# Patient Record
Sex: Male | Born: 1975 | Race: White | Hispanic: No | Marital: Married | Smoking: Former smoker
Health system: Southern US, Community
[De-identification: ages and names within clinical notes are randomized; demographics above are authoritative.]

## PROBLEM LIST (undated history)

## (undated) HISTORY — PX: KNEE SURGERY: SHX244

## (undated) HISTORY — PX: HIP SURGERY: SHX245

## (undated) HISTORY — PX: OTHER SURGICAL HISTORY: SHX169

---

## 1999-10-30 ENCOUNTER — Emergency Department (HOSPITAL_COMMUNITY): Admission: EM | Admit: 1999-10-30 | Discharge: 1999-10-30 | Payer: Self-pay | Admitting: Emergency Medicine

## 1999-10-30 ENCOUNTER — Encounter: Payer: Self-pay | Admitting: Emergency Medicine

## 2005-05-20 ENCOUNTER — Emergency Department (HOSPITAL_COMMUNITY): Admission: EM | Admit: 2005-05-20 | Discharge: 2005-05-20 | Payer: Self-pay | Admitting: Emergency Medicine

## 2008-08-09 ENCOUNTER — Emergency Department (HOSPITAL_COMMUNITY): Admission: EM | Admit: 2008-08-09 | Discharge: 2008-08-09 | Payer: Self-pay | Admitting: Family Medicine

## 2008-10-30 ENCOUNTER — Emergency Department (HOSPITAL_BASED_OUTPATIENT_CLINIC_OR_DEPARTMENT_OTHER): Admission: EM | Admit: 2008-10-30 | Discharge: 2008-10-30 | Payer: Self-pay | Admitting: Emergency Medicine

## 2008-10-30 ENCOUNTER — Ambulatory Visit: Payer: Self-pay | Admitting: Diagnostic Radiology

## 2009-07-22 ENCOUNTER — Encounter: Admission: RE | Admit: 2009-07-22 | Discharge: 2009-07-22 | Payer: Self-pay | Admitting: Family Medicine

## 2009-09-13 ENCOUNTER — Inpatient Hospital Stay (HOSPITAL_COMMUNITY): Admission: AC | Admit: 2009-09-13 | Discharge: 2009-09-22 | Payer: Self-pay

## 2009-11-21 ENCOUNTER — Ambulatory Visit (HOSPITAL_COMMUNITY): Admission: RE | Admit: 2009-11-21 | Discharge: 2009-11-21 | Payer: Self-pay | Admitting: Orthopedic Surgery

## 2009-11-24 ENCOUNTER — Ambulatory Visit (HOSPITAL_COMMUNITY): Admission: RE | Admit: 2009-11-24 | Discharge: 2009-11-24 | Payer: Self-pay | Admitting: Orthopedic Surgery

## 2009-12-07 ENCOUNTER — Emergency Department (HOSPITAL_BASED_OUTPATIENT_CLINIC_OR_DEPARTMENT_OTHER): Admission: EM | Admit: 2009-12-07 | Discharge: 2009-12-07 | Payer: Self-pay | Admitting: Emergency Medicine

## 2010-01-17 ENCOUNTER — Ambulatory Visit (HOSPITAL_BASED_OUTPATIENT_CLINIC_OR_DEPARTMENT_OTHER): Admission: RE | Admit: 2010-01-17 | Discharge: 2010-01-17 | Payer: Self-pay | Admitting: Specialist

## 2010-07-31 ENCOUNTER — Emergency Department (HOSPITAL_COMMUNITY): Payer: Commercial Managed Care - PPO

## 2010-07-31 ENCOUNTER — Emergency Department (HOSPITAL_COMMUNITY)
Admission: EM | Admit: 2010-07-31 | Discharge: 2010-07-31 | Disposition: A | Discharge status: Home / Self Care | Payer: Commercial Managed Care - PPO | Attending: Emergency Medicine | Admitting: Emergency Medicine

## 2010-07-31 DIAGNOSIS — M545 Low back pain, unspecified: Secondary | ICD-10-CM | POA: Insufficient documentation

## 2010-09-09 LAB — POCT HEMOGLOBIN-HEMACUE: Hemoglobin: 14.4 g/dL (ref 13.0–17.0)

## 2010-09-18 LAB — PROTIME-INR: INR: 1.06 (ref 0.00–1.49)

## 2010-09-18 LAB — COMPREHENSIVE METABOLIC PANEL
ALT: 12 U/L (ref 0–53)
AST: 22 U/L (ref 0–37)
Albumin: 2.5 g/dL — ABNORMAL LOW (ref 3.5–5.2)
Albumin: 2.7 g/dL — ABNORMAL LOW (ref 3.5–5.2)
Alkaline Phosphatase: 42 U/L (ref 39–117)
Alkaline Phosphatase: 44 U/L (ref 39–117)
BUN: 6 mg/dL (ref 6–23)
Chloride: 106 mEq/L (ref 96–112)
Creatinine, Ser: 1 mg/dL (ref 0.4–1.5)
Creatinine, Ser: 1.01 mg/dL (ref 0.4–1.5)
GFR calc Af Amer: >60 mL/min (ref >60)
GFR calc non Af Amer: >60 mL/min (ref >60)
GFR calc non Af Amer: >60 mL/min (ref >60)
Glucose, Bld: 107 mg/dL — ABNORMAL HIGH (ref 70–99)
Potassium: 3.5 mEq/L (ref 3.5–5.1)
Sodium: 138 mEq/L (ref 135–145)
Total Bilirubin: 0.7 mg/dL (ref 0.3–1.2)

## 2010-09-18 LAB — CBC
HCT: 24.3 % — ABNORMAL LOW (ref 39.0–52.0)
HCT: 24.4 % — ABNORMAL LOW (ref 39.0–52.0)
HCT: 27.9 % — ABNORMAL LOW (ref 39.0–52.0)
HCT: 29.2 % — ABNORMAL LOW (ref 39.0–52.0)
Hemoglobin: 14.4 g/dL (ref 13.0–17.0)
Hemoglobin: 8.3 g/dL — ABNORMAL LOW (ref 13.0–17.0)
Hemoglobin: 8.4 g/dL — ABNORMAL LOW (ref 13.0–17.0)
Hemoglobin: 9.7 g/dL — ABNORMAL LOW (ref 13.0–17.0)
Hemoglobin: 9.8 g/dL — ABNORMAL LOW (ref 13.0–17.0)
MCHC: 33.8 g/dL (ref 30.0–36.0)
MCHC: 34 g/dL (ref 30.0–36.0)
MCHC: 34.3 g/dL (ref 30.0–36.0)
MCHC: 34.7 g/dL (ref 30.0–36.0)
MCHC: 34.8 g/dL (ref 30.0–36.0)
MCHC: 35.1 g/dL (ref 30.0–36.0)
MCV: 93.1 fL (ref 78.0–100.0)
MCV: 93.3 fL (ref 78.0–100.0)
MCV: 93.8 fL (ref 78.0–100.0)
Platelets: 159 10*3/uL (ref 150–400)
Platelets: 253 10*3/uL (ref 150–400)
Platelets: 277 10*3/uL (ref 150–400)
Platelets: 293 10*3/uL (ref 150–400)
RBC: 2.58 MIL/uL — ABNORMAL LOW (ref 4.22–5.81)
RBC: 3 MIL/uL — ABNORMAL LOW (ref 4.22–5.81)
RBC: 3.06 MIL/uL — ABNORMAL LOW (ref 4.22–5.81)
RBC: 3.12 MIL/uL — ABNORMAL LOW (ref 4.22–5.81)
RBC: 3.19 MIL/uL — ABNORMAL LOW (ref 4.22–5.81)
RBC: 4.45 MIL/uL (ref 4.22–5.81)
RDW: 12.3 % (ref 11.5–15.5)
RDW: 12.8 % (ref 11.5–15.5)
RDW: 12.8 % (ref 11.5–15.5)
RDW: 12.9 % (ref 11.5–15.5)
RDW: 13.1 % (ref 11.5–15.5)
WBC: 10.4 10*3/uL (ref 4.0–10.5)
WBC: 12.1 10*3/uL — ABNORMAL HIGH (ref 4.0–10.5)
WBC: 8.1 10*3/uL (ref 4.0–10.5)

## 2010-09-18 LAB — TYPE AND SCREEN: Antibody Screen: NEGATIVE

## 2010-09-18 LAB — POCT I-STAT 4, (NA,K, GLUC, HGB,HCT)
Glucose, Bld: 123 mg/dL — ABNORMAL HIGH (ref 70–99)
Glucose, Bld: 140 mg/dL — ABNORMAL HIGH (ref 70–99)
Glucose, Bld: 169 mg/dL — ABNORMAL HIGH (ref 70–99)
HCT: 23 % — ABNORMAL LOW (ref 39.0–52.0)
HCT: 24 % — ABNORMAL LOW (ref 39.0–52.0)
Hemoglobin: 7.8 g/dL — ABNORMAL LOW (ref 13.0–17.0)
Hemoglobin: 8.2 g/dL — ABNORMAL LOW (ref 13.0–17.0)
Potassium: 4 meq/L (ref 3.5–5.1)
Potassium: 4.2 meq/L (ref 3.5–5.1)
Sodium: 138 meq/L (ref 135–145)
Sodium: 138 meq/L (ref 135–145)
Sodium: 139 meq/L (ref 135–145)

## 2010-09-18 LAB — BASIC METABOLIC PANEL
BUN: 10 mg/dL (ref 6–23)
BUN: 13 mg/dL (ref 6–23)
BUN: 6 mg/dL (ref 6–23)
BUN: 6 mg/dL (ref 6–23)
BUN: 9 mg/dL (ref 6–23)
CO2: 31 mEq/L (ref 19–32)
CO2: 31 mEq/L (ref 19–32)
CO2: 32 mEq/L (ref 19–32)
CO2: 34 mEq/L — ABNORMAL HIGH (ref 19–32)
Calcium: 8.4 mg/dL (ref 8.4–10.5)
Calcium: 8.6 mg/dL (ref 8.4–10.5)
Calcium: 8.7 mg/dL (ref 8.4–10.5)
Calcium: 8.8 mg/dL (ref 8.4–10.5)
Calcium: 8.9 mg/dL (ref 8.4–10.5)
Chloride: 100 mEq/L (ref 96–112)
Chloride: 102 mEq/L (ref 96–112)
Creatinine, Ser: 0.99 mg/dL (ref 0.4–1.5)
Creatinine, Ser: 1.01 mg/dL (ref 0.4–1.5)
Creatinine, Ser: 1.02 mg/dL (ref 0.4–1.5)
Creatinine, Ser: 1.03 mg/dL (ref 0.4–1.5)
GFR calc Af Amer: >60 mL/min (ref >60)
GFR calc Af Amer: >60 mL/min (ref >60)
GFR calc Af Amer: >60 mL/min (ref >60)
GFR calc Af Amer: >60 mL/min (ref >60)
GFR calc Af Amer: >60 mL/min (ref >60)
GFR calc non Af Amer: 59 mL/min — ABNORMAL LOW (ref >60)
GFR calc non Af Amer: >60 mL/min (ref >60)
GFR calc non Af Amer: >60 mL/min (ref >60)
GFR calc non Af Amer: >60 mL/min (ref >60)
Glucose, Bld: 103 mg/dL — ABNORMAL HIGH (ref 70–99)
Glucose, Bld: 110 mg/dL — ABNORMAL HIGH (ref 70–99)
Glucose, Bld: 111 mg/dL — ABNORMAL HIGH (ref 70–99)
Glucose, Bld: 159 mg/dL — ABNORMAL HIGH (ref 70–99)
Glucose, Bld: 94 mg/dL (ref 70–99)
Potassium: 3 meq/L — ABNORMAL LOW (ref 3.5–5.1)
Potassium: 3.6 mEq/L (ref 3.5–5.1)
Potassium: 3.8 mEq/L (ref 3.5–5.1)
Potassium: 3.8 mEq/L (ref 3.5–5.1)
Potassium: 3.9 mEq/L (ref 3.5–5.1)
Sodium: 136 mEq/L (ref 135–145)
Sodium: 138 mEq/L (ref 135–145)
Sodium: 138 mEq/L (ref 135–145)
Sodium: 139 mEq/L (ref 135–145)
Sodium: 140 meq/L (ref 135–145)

## 2010-09-18 LAB — MRSA PCR SCREENING: MRSA by PCR: NEGATIVE

## 2010-09-18 LAB — ABO/RH: ABO/RH(D): O NEG

## 2010-12-05 ENCOUNTER — Inpatient Hospital Stay (INDEPENDENT_AMBULATORY_CARE_PROVIDER_SITE_OTHER)
Admission: RE | Admit: 2010-12-05 | Discharge: 2010-12-05 | Disposition: A | Discharge status: Home / Self Care | Payer: Commercial Managed Care - PPO | Source: Ambulatory Visit | Attending: Family Medicine | Admitting: Family Medicine

## 2010-12-05 DIAGNOSIS — L923 Foreign body granuloma of the skin and subcutaneous tissue: Secondary | ICD-10-CM

## 2012-05-11 ENCOUNTER — Emergency Department (HOSPITAL_COMMUNITY)
Admission: EM | Admit: 2012-05-11 | Discharge: 2012-05-11 | Disposition: A | Discharge status: Home / Self Care | Payer: Commercial Managed Care - PPO | Source: Home / Self Care | Attending: Emergency Medicine | Admitting: Emergency Medicine

## 2012-05-11 ENCOUNTER — Emergency Department (INDEPENDENT_AMBULATORY_CARE_PROVIDER_SITE_OTHER): Payer: Commercial Managed Care - PPO

## 2012-05-11 ENCOUNTER — Encounter (HOSPITAL_COMMUNITY): Payer: Self-pay | Admitting: Emergency Medicine

## 2012-05-11 DIAGNOSIS — T148XXA Other injury of unspecified body region, initial encounter: Secondary | ICD-10-CM

## 2012-05-11 DIAGNOSIS — R229 Localized swelling, mass and lump, unspecified: Secondary | ICD-10-CM

## 2012-05-11 MED ORDER — IBUPROFEN 800 MG PO TABS: 800.0000 mg | ORAL_TABLET | Freq: Three times a day (TID) | ORAL | Status: DC

## 2012-05-11 NOTE — ED Notes (Signed)
Pt states that he smashed his middle finger on the right hand between two pieces of metal yesterday evening finger is swollen and gradually getting worse.

## 2012-05-11 NOTE — ED Provider Notes (Signed)
Medical screening examination/treatment/procedure(s) were performed by non-physician practitioner and as supervising physician I was immediately available for consultation/collaboration.  Luz Mares, M.D.   Mackinsey Pelland C Jadamarie Butson, MD 05/11/12 2138 

## 2012-05-11 NOTE — ED Provider Notes (Signed)
History     CSN: [id number]  Arrival date & time 05/11/12  1646   First MD Initiated Contact with Patient 05/11/12 1952      Chief Complaint  Patient presents with  . Finger Injury    right hand middle finger injury. pt smashed finger btwn two pieces of metal. finger is swollen and gradually getting worse. injury happend yesterday afternoon    (Consider location/radiation/quality/duration/timing/severity/associated sxs/prior treatment) HPI Comments: Pt reports R hand/middle finger was smashed between to heavy metal objects yesterday. Is concerned something is broken.  C/o swelling, pain.  Reports tetanus shot within 2 years.   Patient is a 36 y.o. male presenting with hand pain. The history is provided by the patient.  Hand Pain This is a new problem. The current episode started yesterday. The problem occurs constantly. The problem has not changed since onset.Exacerbated by: movement, activity, palpation. Nothing relieves the symptoms. He has tried a cold compress (ibuprofen) for the symptoms. The treatment provided no relief.    History reviewed. No pertinent past medical history.  Past Surgical History  Procedure Date  . Arm   . Hip surgery     left  . Thumb surgery   . Knee surgery     right knee    History reviewed. No pertinent family history.  History  Substance Use Topics  . Smoking status: Never Smoker   . Smokeless tobacco: Not on file  . Alcohol Use: No      Review of Systems  Constitutional: Negative for fever and chills.  Musculoskeletal: Positive for joint swelling.  Skin: Positive for wound. Negative for color change.  Neurological: Positive for numbness.    Allergies  Review of patient's allergies indicates no known allergies.  Home Medications   Current Outpatient Rx  Name  Route  Sig  Dispense  Refill  . IBUPROFEN 800 MG PO TABS   Oral   Take 1 tablet (800 mg total) by mouth 3 (three) times daily.   21 tablet   0     BP 123/65   Pulse 59  Temp 98.7 F (37.1 C) (Oral)  Resp 20  SpO2 97%  Physical Exam  Constitutional: He appears well-developed and well-nourished. No distress.  Musculoskeletal:       Right hand: He exhibits decreased range of motion, tenderness, bony tenderness and swelling.       Hands: Neurological:       Sensation intact to R middle finger  Skin: Skin is warm, dry and intact. No bruising noted. No pallor.       Brisk cap refill to R distal middle finger    ED Course  Procedures (including critical care time)  Labs Reviewed - No data to display Dg Finger Middle Right  05/11/2012  *RADIOLOGY REPORT*  Clinical Data: Right long finger injury.  RIGHT MIDDLE FINGER 2+V  Comparison: None.  Findings: Soft tissue swelling is present over the proximal phalanx of the right long finger.  No fracture.  No radiopaque foreign body.  IMPRESSION: Soft tissue swelling without osseous injury.   Original Report Authenticated By: Andreas Newport, M.D.      1. Crush injury   2. Soft tissue swelling       MDM  Reviewed with pt reasons for seeking emergent care        Cathlyn Parsons, NP 05/11/12 2133

## 2012-06-09 ENCOUNTER — Encounter (HOSPITAL_COMMUNITY): Payer: Self-pay | Admitting: *Deleted

## 2012-06-09 ENCOUNTER — Emergency Department (HOSPITAL_COMMUNITY)
Admission: EM | Admit: 2012-06-09 | Discharge: 2012-06-09 | Disposition: A | Discharge status: Home / Self Care | Payer: Commercial Managed Care - PPO | Source: Home / Self Care | Attending: Emergency Medicine | Admitting: Emergency Medicine

## 2012-06-09 ENCOUNTER — Emergency Department (INDEPENDENT_AMBULATORY_CARE_PROVIDER_SITE_OTHER): Payer: Commercial Managed Care - PPO

## 2012-06-09 DIAGNOSIS — T148XXA Other injury of unspecified body region, initial encounter: Secondary | ICD-10-CM

## 2012-06-09 MED ORDER — HYDROCODONE-ACETAMINOPHEN 5-325 MG PO TABS
2.0000 | ORAL_TABLET | Freq: Once | ORAL | Status: AC
  Administered 2012-06-09: 2 via ORAL

## 2012-06-09 MED ORDER — HYDROCODONE-ACETAMINOPHEN 5-325 MG PO TABS
ORAL_TABLET | ORAL | Status: AC
  Filled 2012-06-09: qty 2

## 2012-06-09 MED ORDER — HYDROCODONE-ACETAMINOPHEN 5-325 MG PO TABS: ORAL_TABLET | ORAL | Status: DC

## 2012-06-09 NOTE — ED Notes (Signed)
Ice   And   Elevation

## 2012-06-09 NOTE — ED Notes (Signed)
xl  r  Arm  Sling  Ace  Bandage  Wrapped  Not to  Tight and  Applied  r  Arm

## 2012-06-09 NOTE — ED Provider Notes (Signed)
Chief Complaint  Patient presents with  . 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 This afternoon around 2:30 PM he injured his right arm, crushing it between the steel lip of his bucket truck and the trunk of a tree. There is swelling over the forearm. He is able to move his elbow and his wrist although it hurts to move his fingers. He has some numbness and tingling of the tips of his middle 3 fingers. There is no muscle weakness other than that due to pain.  Review of Systems:  Other than noted above, the patient denies any of the following symptoms: Systemic:  No fevers, chills, sweats, or aches.  No fatigue or tiredness. Musculoskeletal:  No joint pain, arthritis, bursitis, swelling, back pain, or neck pain. Neurological:  No muscular weakness, paresthesias, headache, or trouble with speech or coordination.  No dizziness.  PMFSH:  Past medical history, family history, social history, meds, and allergies were reviewed.  Physical Exam:   Vital signs:  BP 144/84  Pulse 72  Temp 98.6 F (37 C) (Oral)  SpO2 100% Gen:  Alert and oriented times 3.  In no distress. Musculoskeletal: There is swelling and bruising over the volar and the dorsal forearm and is tender to touch in that area. Is no obvious deformity. He is able to move his elbow and his wrist but with pain. He is able to flex extend his fingers but with pain. Radial pulses full. Good capillary refill of all his fingers. He has good sensation to light touch but diminished 2 point discrimination in the middle 3 fingers. Otherwise, all joints had a full a ROM with no swelling, bruising or deformity.  No edema, pulses full. Extremities were warm and pink.  Capillary refill was brisk.  Skin:  Clear, warm and dry.  No rash. Neuro:  Alert and oriented times 3.  Muscle strength was normal.  Sensation was intact to light touch.   Radiology:  Dg Forearm Right  06/09/2012   *RADIOLOGY REPORT*  Clinical Data: Traumatic injury with pain  RIGHT FOREARM - 2 VIEW  Comparison: None.  Findings: Soft tissue swelling in the mid forearm is noted.  No acute fracture or dislocation is seen.  IMPRESSION: The soft tissue swelling without bony abnormality.   Original Report Authenticated By: Alcide Clever, M.D.    I reviewed the images independently and personally and concur with the radiologist's findings.  Course in Urgent Care Center:   He was placed in a sling and an Ace wrap was applied loosely. He was given hydrocodone/APAP 5/325, 2 capsules by mouth and tolerated this well without any immediate side effects.  Assessment:  The encounter diagnosis was Crush injury.  There is no evidence right now for compartment syndrome although this is a definite possibility particularly with swelling. I told him to keep a watch out for the symptoms which would include pallor, paresthesias, or increase in pain. His wife knows how to check his pulse, and I told her to keep a watch on that as well. I told him to rest the next few days, elevate the extremity, use ice, and Ace wrap, and is to followup with Dr. Amanda Pea in 2 days.  Plan:   1.  The following meds were prescribed:   New Prescriptions   HYDROCODONE-ACETAMINOPHEN (NORCO/VICODIN) 5-325 MG PER TABLET    1 to 2 tabs every 4 to 6 hours as needed for  pain.   2.  The patient was instructed in symptomatic care, including rest and activity, elevation, application of ice and compression.  Appropriate handouts were given. 3.  The patient was told to return if becoming worse in any way, if no better in 3 or 4 days, and given some red flag symptoms that would indicate earlier return.   4.  The patient was told to follow up with Dr. Amanda Pea in 2 days.    Reuben Likes, MD 06/09/12 2032

## 2012-06-09 NOTE — ED Notes (Signed)
Pt  Reports  He     Sustained   A         Crush  Injury  To   r  forerarm          He  Has  Pain  /   Swelling  To  The  Affected  Arm  With  Bruising  Present       rom is  Limited          He     Is  Awake  Alert  And  Oriented  He  Ambulated  To room with a  Steady  Fluid  Gait

## 2012-06-11 ENCOUNTER — Observation Stay (HOSPITAL_COMMUNITY)
Admission: RE | Admit: 2012-06-11 | Discharge: 2012-06-12 | Disposition: A | Discharge status: Home / Self Care | Payer: 59 | Source: Ambulatory Visit | Attending: Orthopedic Surgery | Admitting: Orthopedic Surgery

## 2012-06-11 ENCOUNTER — Encounter (HOSPITAL_COMMUNITY): Payer: Self-pay | Admitting: Anesthesiology

## 2012-06-11 ENCOUNTER — Encounter (HOSPITAL_COMMUNITY): Payer: Self-pay | Admitting: Pharmacy Technician

## 2012-06-11 ENCOUNTER — Encounter (HOSPITAL_COMMUNITY): Payer: Self-pay | Admitting: *Deleted

## 2012-06-11 ENCOUNTER — Ambulatory Visit (HOSPITAL_COMMUNITY): Payer: 59 | Admitting: Anesthesiology

## 2012-06-11 ENCOUNTER — Encounter (HOSPITAL_COMMUNITY)
Admission: RE | Disposition: A | Discharge status: Home / Self Care | Payer: Self-pay | Source: Ambulatory Visit | Attending: Orthopedic Surgery

## 2012-06-11 DIAGNOSIS — T79A0XA Compartment syndrome, unspecified, initial encounter: Secondary | ICD-10-CM

## 2012-06-11 DIAGNOSIS — W230XXA Caught, crushed, jammed, or pinched between moving objects, initial encounter: Secondary | ICD-10-CM | POA: Insufficient documentation

## 2012-06-11 DIAGNOSIS — S5780XA Crushing injury of unspecified forearm, initial encounter: Principal | ICD-10-CM | POA: Insufficient documentation

## 2012-06-11 DIAGNOSIS — T79A19A Traumatic compartment syndrome of unspecified upper extremity, initial encounter: Secondary | ICD-10-CM | POA: Insufficient documentation

## 2012-06-11 DIAGNOSIS — Z23 Encounter for immunization: Secondary | ICD-10-CM | POA: Insufficient documentation

## 2012-06-11 HISTORY — PX: FASCIOTOMY: SHX132

## 2012-06-11 LAB — CBC
HCT: 42.7 % (ref 39.0–52.0)
Hemoglobin: 14.6 g/dL (ref 13.0–17.0)
MCHC: 34.2 g/dL (ref 30.0–36.0)
RBC: 4.66 MIL/uL (ref 4.22–5.81)
WBC: 11 10*3/uL — ABNORMAL HIGH (ref 4.0–10.5)

## 2012-06-11 LAB — SURGICAL PCR SCREEN
MRSA, PCR: NEGATIVE
Staphylococcus aureus: NEGATIVE

## 2012-06-11 SURGERY — FASCIOTOMY, UPPER EXTREMITY
Anesthesia: General | Site: Arm Lower | Laterality: Right | Wound class: Clean

## 2012-06-11 MED ORDER — FENTANYL CITRATE 0.05 MG/ML IJ SOLN
INTRAMUSCULAR | Status: AC
  Filled 2012-06-11: qty 2

## 2012-06-11 MED ORDER — METHOCARBAMOL 500 MG PO TABS
500.0000 mg | ORAL_TABLET | Freq: Four times a day (QID) | ORAL | Status: DC | PRN
  Administered 2012-06-11 – 2012-06-12 (×2): 500 mg via ORAL
  Filled 2012-06-11 (×2): qty 1

## 2012-06-11 MED ORDER — ALPRAZOLAM 0.5 MG PO TABS
0.5000 mg | ORAL_TABLET | Freq: Four times a day (QID) | ORAL | Status: DC | PRN
  Administered 2012-06-12: 0.5 mg via ORAL
  Filled 2012-06-11: qty 1

## 2012-06-11 MED ORDER — MUPIROCIN 2 % EX OINT: TOPICAL_OINTMENT | Freq: Once | CUTANEOUS | Status: DC

## 2012-06-11 MED ORDER — CEFAZOLIN SODIUM 1-5 GM-% IV SOLN
1.0000 g | Freq: Three times a day (TID) | INTRAVENOUS | Status: DC
  Administered 2012-06-11 – 2012-06-12 (×3): 1 g via INTRAVENOUS
  Filled 2012-06-11 (×5): qty 50

## 2012-06-11 MED ORDER — LACTATED RINGERS IV SOLN
INTRAVENOUS | Status: DC
  Administered 2012-06-11: 11:00:00 via INTRAVENOUS

## 2012-06-11 MED ORDER — CEFAZOLIN SODIUM-DEXTROSE 2-3 GM-% IV SOLR
2.0000 g | INTRAVENOUS | Status: AC
  Administered 2012-06-11: 2 g via INTRAVENOUS

## 2012-06-11 MED ORDER — KETOROLAC TROMETHAMINE 30 MG/ML IJ SOLN
15.0000 mg | Freq: Once | INTRAMUSCULAR | Status: AC | PRN
  Administered 2012-06-11: 15 mg via INTRAVENOUS

## 2012-06-11 MED ORDER — ONDANSETRON HCL 4 MG PO TABS: 4.0000 mg | ORAL_TABLET | Freq: Four times a day (QID) | ORAL | Status: DC | PRN

## 2012-06-11 MED ORDER — NEOSTIGMINE METHYLSULFATE 1 MG/ML IJ SOLN
INTRAMUSCULAR | Status: DC | PRN
  Administered 2012-06-11: 2 mg via INTRAVENOUS

## 2012-06-11 MED ORDER — VITAMIN C 500 MG PO TABS
1000.0000 mg | ORAL_TABLET | Freq: Every day | ORAL | Status: DC
  Administered 2012-06-12: 1000 mg via ORAL
  Filled 2012-06-11 (×2): qty 2

## 2012-06-11 MED ORDER — DIPHENHYDRAMINE HCL 25 MG PO CAPS
25.0000 mg | ORAL_CAPSULE | Freq: Four times a day (QID) | ORAL | Status: DC | PRN
  Administered 2012-06-12: 50 mg via ORAL
  Filled 2012-06-11: qty 2

## 2012-06-11 MED ORDER — SODIUM CHLORIDE 0.45 % IV SOLN: INTRAVENOUS | Status: DC

## 2012-06-11 MED ORDER — SUCCINYLCHOLINE CHLORIDE 20 MG/ML IJ SOLN
INTRAMUSCULAR | Status: DC | PRN
  Administered 2012-06-11: 100 mg via INTRAVENOUS

## 2012-06-11 MED ORDER — PROMETHAZINE HCL 25 MG/ML IJ SOLN: 6.2500 mg | INTRAMUSCULAR | Status: DC | PRN

## 2012-06-11 MED ORDER — ONDANSETRON HCL 4 MG/2ML IJ SOLN
INTRAMUSCULAR | Status: DC | PRN
  Administered 2012-06-11: 4 mg via INTRAVENOUS

## 2012-06-11 MED ORDER — CEFAZOLIN SODIUM-DEXTROSE 2-3 GM-% IV SOLR
INTRAVENOUS | Status: AC
  Filled 2012-06-11: qty 50

## 2012-06-11 MED ORDER — FAMOTIDINE 20 MG PO TABS
20.0000 mg | ORAL_TABLET | Freq: Two times a day (BID) | ORAL | Status: DC | PRN
  Filled 2012-06-11: qty 1

## 2012-06-11 MED ORDER — INFLUENZA VIRUS VACC SPLIT PF IM SUSP
0.5000 mL | INTRAMUSCULAR | Status: AC
  Administered 2012-06-12: 0.5 mL via INTRAMUSCULAR
  Filled 2012-06-11: qty 0.5

## 2012-06-11 MED ORDER — LACTATED RINGERS IV SOLN
INTRAVENOUS | Status: DC | PRN
  Administered 2012-06-11 (×2): via INTRAVENOUS

## 2012-06-11 MED ORDER — GLYCOPYRROLATE 0.2 MG/ML IJ SOLN
INTRAMUSCULAR | Status: DC | PRN
  Administered 2012-06-11: 0.2 mg via INTRAVENOUS
  Administered 2012-06-11: 0.4 mg via INTRAVENOUS

## 2012-06-11 MED ORDER — CEFAZOLIN SODIUM 1-5 GM-% IV SOLN
1.0000 g | INTRAVENOUS | Status: AC
  Filled 2012-06-11 (×2): qty 50

## 2012-06-11 MED ORDER — MORPHINE SULFATE 2 MG/ML IJ SOLN: 1.0000 mg | INTRAMUSCULAR | Status: DC | PRN

## 2012-06-11 MED ORDER — FENTANYL CITRATE 0.05 MG/ML IJ SOLN
INTRAMUSCULAR | Status: DC | PRN
  Administered 2012-06-11: 100 ug via INTRAVENOUS

## 2012-06-11 MED ORDER — 0.9 % SODIUM CHLORIDE (POUR BTL) OPTIME
TOPICAL | Status: DC | PRN
  Administered 2012-06-11: 1000 mL

## 2012-06-11 MED ORDER — MUPIROCIN 2 % EX OINT
TOPICAL_OINTMENT | CUTANEOUS | Status: AC
  Administered 2012-06-11: 1 via NASAL
  Filled 2012-06-11: qty 22

## 2012-06-11 MED ORDER — FENTANYL CITRATE 0.05 MG/ML IJ SOLN
25.0000 ug | INTRAMUSCULAR | Status: DC | PRN
  Administered 2012-06-11 (×4): 25 ug via INTRAVENOUS

## 2012-06-11 MED ORDER — ROCURONIUM BROMIDE 100 MG/10ML IV SOLN
INTRAVENOUS | Status: DC | PRN
  Administered 2012-06-11: 10 mg via INTRAVENOUS
  Administered 2012-06-11: 5 mg via INTRAVENOUS

## 2012-06-11 MED ORDER — MIDAZOLAM HCL 2 MG/2ML IJ SOLN: 0.5000 mg | Freq: Once | INTRAMUSCULAR | Status: DC | PRN

## 2012-06-11 MED ORDER — TEMAZEPAM 15 MG PO CAPS
15.0000 mg | ORAL_CAPSULE | Freq: Every evening | ORAL | Status: DC | PRN
  Administered 2012-06-12: 15 mg via ORAL
  Filled 2012-06-11: qty 1

## 2012-06-11 MED ORDER — MIDAZOLAM HCL 5 MG/5ML IJ SOLN
INTRAMUSCULAR | Status: DC | PRN
  Administered 2012-06-11: 2 mg via INTRAVENOUS

## 2012-06-11 MED ORDER — MEPERIDINE HCL 25 MG/ML IJ SOLN: 6.2500 mg | INTRAMUSCULAR | Status: DC | PRN

## 2012-06-11 MED ORDER — PROPOFOL 10 MG/ML IV BOLUS
INTRAVENOUS | Status: DC | PRN
  Administered 2012-06-11: 200 mg via INTRAVENOUS

## 2012-06-11 MED ORDER — OXYCODONE HCL 5 MG PO TABS
5.0000 mg | ORAL_TABLET | ORAL | Status: DC | PRN
  Administered 2012-06-11 – 2012-06-12 (×5): 10 mg via ORAL
  Filled 2012-06-11 (×2): qty 2
  Filled 2012-06-11: qty 1
  Filled 2012-06-11 (×3): qty 2

## 2012-06-11 MED ORDER — ONDANSETRON HCL 4 MG/2ML IJ SOLN: 4.0000 mg | Freq: Four times a day (QID) | INTRAMUSCULAR | Status: DC | PRN

## 2012-06-11 MED ORDER — CHLORHEXIDINE GLUCONATE 4 % EX LIQD: 60.0000 mL | Freq: Once | CUTANEOUS | Status: DC

## 2012-06-11 MED ORDER — KETOROLAC TROMETHAMINE 30 MG/ML IJ SOLN
INTRAMUSCULAR | Status: AC
  Filled 2012-06-11: qty 1

## 2012-06-11 MED ORDER — SENNA 8.6 MG PO TABS
1.0000 | ORAL_TABLET | Freq: Two times a day (BID) | ORAL | Status: DC
  Administered 2012-06-11 – 2012-06-12 (×2): 8.6 mg via ORAL
  Filled 2012-06-11 (×3): qty 1

## 2012-06-11 MED ORDER — METHOCARBAMOL 100 MG/ML IJ SOLN
500.0000 mg | Freq: Four times a day (QID) | INTRAMUSCULAR | Status: DC | PRN
Start: 2012-06-11 — End: 2012-06-12
  Filled 2012-06-11: qty 5

## 2012-06-11 MED ORDER — LIDOCAINE HCL (CARDIAC) 20 MG/ML IV SOLN
INTRAVENOUS | Status: DC | PRN
  Administered 2012-06-11: 50 mg via INTRAVENOUS

## 2012-06-11 SURGICAL SUPPLY — 45 items
BANDAGE ELASTIC 3 VELCRO ST LF (GAUZE/BANDAGES/DRESSINGS) ×2 IMPLANT
BANDAGE ELASTIC 4 VELCRO ST LF (GAUZE/BANDAGES/DRESSINGS) ×2 IMPLANT
BANDAGE GAUZE ELAST BULKY 4 IN (GAUZE/BANDAGES/DRESSINGS) ×2 IMPLANT
BLADE SURG ROTATE 9660 (MISCELLANEOUS) IMPLANT
BNDG CMPR 9X4 STRL LF SNTH (GAUZE/BANDAGES/DRESSINGS) ×1
BNDG ESMARK 4X9 LF (GAUZE/BANDAGES/DRESSINGS) ×2 IMPLANT
CLOTH BEACON ORANGE TIMEOUT ST (SAFETY) ×2 IMPLANT
CORDS BIPOLAR (ELECTRODE) ×2 IMPLANT
COVER SURGICAL LIGHT HANDLE (MISCELLANEOUS) ×2 IMPLANT
CUFF TOURNIQUET SINGLE 18IN (TOURNIQUET CUFF) ×2 IMPLANT
CUFF TOURNIQUET SINGLE 24IN (TOURNIQUET CUFF) IMPLANT
DECANTER SPIKE VIAL GLASS SM (MISCELLANEOUS) IMPLANT
DRAIN TLS ROUND 10FR (DRAIN) IMPLANT
DRAPE OEC MINIVIEW 54X84 (DRAPES) IMPLANT
DRAPE U-SHAPE 47X51 STRL (DRAPES) ×1 IMPLANT
GAUZE XEROFORM 1X8 LF (GAUZE/BANDAGES/DRESSINGS) ×2 IMPLANT
GLOVE ORTHO TXT STRL SZ7.5 (GLOVE) ×2 IMPLANT
GLOVE SS BIOGEL STRL SZ 8 (GLOVE) ×1 IMPLANT
GLOVE SUPERSENSE BIOGEL SZ 8 (GLOVE) ×1
GOWN PREVENTION PLUS XLARGE (GOWN DISPOSABLE) ×2 IMPLANT
GOWN STRL NON-REIN LRG LVL3 (GOWN DISPOSABLE) ×6 IMPLANT
GOWN STRL REIN XL XLG (GOWN DISPOSABLE) ×4 IMPLANT
KIT BASIN OR (CUSTOM PROCEDURE TRAY) ×2 IMPLANT
KIT ROOM TURNOVER OR (KITS) ×2 IMPLANT
LOOP VESSEL MAXI BLUE (MISCELLANEOUS) IMPLANT
MANIFOLD NEPTUNE II (INSTRUMENTS) ×2 IMPLANT
NEEDLE 22X1 1/2 (OR ONLY) (NEEDLE) IMPLANT
NS IRRIG 1000ML POUR BTL (IV SOLUTION) ×2 IMPLANT
PACK ORTHO EXTREMITY (CUSTOM PROCEDURE TRAY) ×2 IMPLANT
PAD ARMBOARD 7.5X6 YLW CONV (MISCELLANEOUS) ×4 IMPLANT
PAD CAST 4YDX4 CTTN HI CHSV (CAST SUPPLIES) ×1 IMPLANT
PADDING CAST COTTON 4X4 STRL (CAST SUPPLIES) ×2
SPONGE GAUZE 4X4 12PLY (GAUZE/BANDAGES/DRESSINGS) ×2 IMPLANT
SPONGE LAP 4X18 X RAY DECT (DISPOSABLE) ×1 IMPLANT
SUT MNCRL AB 4-0 PS2 18 (SUTURE) ×1 IMPLANT
SUT PROLENE 3 0 PS 2 (SUTURE) ×1 IMPLANT
SUT VIC AB 3-0 FS2 27 (SUTURE) IMPLANT
SYR CONTROL 10ML LL (SYRINGE) IMPLANT
SYSTEM CHEST DRAIN TLS 7FR (DRAIN) IMPLANT
TOWEL OR 17X24 6PK STRL BLUE (TOWEL DISPOSABLE) ×2 IMPLANT
TOWEL OR 17X26 10 PK STRL BLUE (TOWEL DISPOSABLE) ×2 IMPLANT
TUBE CONNECTING 12X1/4 (SUCTIONS) ×2 IMPLANT
TUBE EVACUATION TLS (MISCELLANEOUS) ×1 IMPLANT
UNDERPAD 30X30 INCONTINENT (UNDERPADS AND DIAPERS) ×2 IMPLANT
WATER STERILE IRR 1000ML POUR (IV SOLUTION) ×2 IMPLANT

## 2012-06-11 NOTE — Preoperative (Signed)
Beta Blockers   Reason not to administer Beta Blockers:Not Applicable 

## 2012-06-11 NOTE — Transfer of Care (Signed)
Immediate Anesthesia Transfer of Care Note  Patient: Eduardo Potts  Procedure(s) Performed: Procedure(s) (LRB) with comments: FASCIOTOMY (Right)  Patient Location: PACU  Anesthesia Type:General  Level of Consciousness: awake and alert   Airway & Oxygen Therapy: Patient Spontanous Breathing and Patient connected to nasal cannula oxygen  Post-op Assessment: Report given to PACU RN and Post -op Vital signs reviewed and stable  Post vital signs: Reviewed and stable  Complications: No apparent anesthesia complications

## 2012-06-11 NOTE — Progress Notes (Signed)
Dr. Jean Rosenthal made aware of patient drinking coffee with cream+ sugar 700 am( 8 oz.).  DR. Amanda Pea STATES HE NEEDS TO DO SURGERY DUE TO COMPARTMENT SYNDROME (PER  ADRIAN MURPHY).  DR. Jean Rosenthal, WAS MADE AWARE OF URGENCY.

## 2012-06-11 NOTE — Anesthesia Preprocedure Evaluation (Signed)
Anesthesia Evaluation  Patient identified by MRN, date of birth, ID band Patient awake    Reviewed: Allergy & Precautions, H&P , Patient's Chart, lab work & pertinent test results, reviewed documented beta blocker date and time   History of Anesthesia Complications Negative for: history of anesthetic complications  Airway Mallampati: II TM Distance: >3 FB Neck ROM: full    Dental No notable dental hx.    Pulmonary neg pulmonary ROS,  breath sounds clear to auscultation  Pulmonary exam normal       Cardiovascular Exercise Tolerance: Good negative cardio ROS  Rhythm:regular Rate:Normal     Neuro/Psych negative neurological ROS  negative psych ROS   GI/Hepatic negative GI ROS, Neg liver ROS,   Endo/Other  negative endocrine ROS  Renal/GU negative Renal ROS     Musculoskeletal   Abdominal   Peds  Hematology negative hematology ROS (+)   Anesthesia Other Findings Not adequately NPO.  Discussed with surgeon who says he cannot wait.    Reproductive/Obstetrics negative OB ROS                           Anesthesia Physical Anesthesia Plan  ASA: I and emergent  Anesthesia Plan: General ETT   Post-op Pain Management:    Induction: Rapid sequence, Cricoid pressure planned and Intravenous  Airway Management Planned: Oral ETT  Additional Equipment:   Intra-op Plan:   Post-operative Plan:   Informed Consent: I have reviewed the patients History and Physical, chart, labs and discussed the procedure including the risks, benefits and alternatives for the proposed anesthesia with the patient or authorized representative who has indicated his/her understanding and acceptance.   Dental Advisory Given  Plan Discussed with: CRNA and Surgeon  Anesthesia Plan Comments:         Anesthesia Quick Evaluation

## 2012-06-11 NOTE — Op Note (Signed)
See dictation# [id number] Dominica Severin MD

## 2012-06-11 NOTE — H&P (Signed)
Eduardo Potts is an 36 y.o. male.   Chief Complaint: Compartment syndrome right arm HPI: Patient presents with a compartment syndrome involving in nature about the right upper extremity. He has progressive pain numbness and loss of function. He had his forearm sandwiched between is bucket and a tree. The patient was seen in the emergency room. Date of injury was 2 days ago. Since that time his had evolving numbness and tingling pain cannot sleep and has difficulty with function. I discussed with him these issues and the risk and benefits of surgical release of his compartments. Another patient quite well as I performed reconstructive surgery of his left arm after chainsaw went through it in the distant past. He fortunately did rather well with this. He understands risk and benefits of surgery. We'll proceed with fasciotomies.  History reviewed. No pertinent past medical history.  Past Surgical History  Procedure Date  . Arm   . Hip surgery     left  . Thumb surgery   . Knee surgery     right knee    History reviewed. No pertinent family history. Social History:  reports that he has never smoked. He does not have any smokeless tobacco history on file. He reports that he does not drink alcohol or use illicit drugs.  Allergies:  Allergies  Allergen Reactions  . Hydrocodone-Acetaminophen Itching    Medications Prior to Admission  Medication Sig Dispense Refill  . HYDROcodone-acetaminophen (NORCO/VICODIN) 5-325 MG per tablet Take 1-2 tablets by mouth every 4 (four) hours as needed. For pain.        Results for orders placed during the hospital encounter of 06/11/12 (from the past 48 hour(s))  CBC     Status: Abnormal   Collection Time   06/11/12 10:08 AM      Component Value Range Comment   WBC 11.0 (*) 4.0 - 10.5 K/uL    RBC 4.66  4.22 - 5.81 MIL/uL    Hemoglobin 14.6  13.0 - 17.0 g/dL    HCT 45.4  09.8 - 11.9 %    MCV 91.6  78.0 - 100.0 fL    MCH 31.3  26.0 - 34.0 pg    MCHC 34.2  30.0 - 36.0 g/dL    RDW 14.7  82.9 - 56.2 %    Platelets 217  150 - 400 K/uL    Dg Forearm Right  06/09/2012  *RADIOLOGY REPORT*  Clinical Data: Traumatic injury with pain  RIGHT FOREARM - 2 VIEW  Comparison: None.  Findings: Soft tissue swelling in the mid forearm is noted.  No acute fracture or dislocation is seen.  IMPRESSION: The soft tissue swelling without bony abnormality.   Original Report Authenticated By: Alcide Clever, M.D.     Review of Systems  Constitutional: Negative.   HENT: Negative.   Eyes: Negative.   Respiratory: Negative.   Cardiovascular: Negative.   Gastrointestinal: Negative.   Genitourinary: Negative.   Skin: Negative.   Neurological: Negative.   Endo/Heme/Allergies: Negative.   Psychiatric/Behavioral: Negative.     Blood pressure 106/69, pulse 78, temperature 98.4 F (36.9 C), temperature source Oral, resp. rate 20, height 6\' 1"  (1.854 m), weight 94.2 kg (207 lb 10.8 oz), SpO2 96.00%. Physical Exam evolving compartment syndrome right forearm numbness and tingling about the superficial radial nerve ulnar nerve and median nerve is appreciated he has no more pulse he has pain with passive extension of the fingers and limited motion secondary to pain. His compartments are firm. He has  no evidence of lower extremity trauma.Marland KitchenMarland KitchenThe patient is alert and oriented in no acute distress the patient complains of pain in the affected upper extremity. The patient is noted to have a normal HEENT exam. Lung fields show equal chest expansion and no shortness of breath abdomen exam is nontender without distention. Lower extremity examination does not show any fracture dislocation or blood clot symptoms. Pelvis is stable neck and back are stable and nontender  Assessment/Plan .Marland KitchenWe are planning surgery for your upper extremity. The risk and benefits of surgery include risk of bleeding infection anesthesia damage to normal structures and failure of the surgery to accomplish  its intended goals of relieving symptoms and restoring function with this in mind we'll going to proceed. I have specifically discussed with the patient the pre-and postoperative regime and the does and don'ts and risk and benefits in great detail. Risk and benefits of surgery also include risk of dystrophy chronic nerve pain failure of the healing process to go onto completion and other inherent risks of surgery The relavent the pathophysiology of the disease/injury process, as well as the alternatives for treatment and postoperative course of action has been discussed in great detail with the patient who desires to proceed.  We will do everything in our power to help you (the patient) restore function to the upper extremity. Is a pleasure to see this patient today.   Irvine Glorioso III,Louvina Cleary M 06/11/2012, 11:40 AM

## 2012-06-11 NOTE — Anesthesia Postprocedure Evaluation (Signed)
Anesthesia Post Note  Patient: Eduardo Potts  Procedure(s) Performed: Procedure(s) (LRB): FASCIOTOMY (Right)  Anesthesia type: GA  Patient location: PACU  Post pain: Pain level controlled  Post assessment: Post-op Vital signs reviewed  Last Vitals:  Filed Vitals:   06/11/12 1317  BP: 124/72  Pulse: 92  Temp:   Resp: 15    Post vital signs: Reviewed  Level of consciousness: sedated  Complications: No apparent anesthesia complications

## 2012-06-11 NOTE — Anesthesia Procedure Notes (Signed)
Procedure Name: Intubation Date/Time: 06/11/2012 11:56 AM Performed by: Gwenyth Allegra Pre-anesthesia Checklist: Patient identified, Timeout performed, Emergency Drugs available, Suction available and Patient being monitored Patient Re-evaluated:Patient Re-evaluated prior to inductionOxygen Delivery Method: Circle system utilized Preoxygenation: Pre-oxygenation with 100% oxygen Intubation Type: IV induction, Cricoid Pressure applied and Rapid sequence Laryngoscope Size: Mac and 4 Grade View: Grade I Tube type: Oral Tube size: 7.5 mm Number of attempts: 1 Airway Equipment and Method: Stylet Secured at: 22 cm Tube secured with: Tape Dental Injury: Teeth and Oropharynx as per pre-operative assessment

## 2012-06-12 ENCOUNTER — Encounter (HOSPITAL_COMMUNITY): Payer: Self-pay | Admitting: Orthopedic Surgery

## 2012-06-12 MED ORDER — METHOCARBAMOL 500 MG PO TABS: 500.0000 mg | ORAL_TABLET | Freq: Four times a day (QID) | ORAL | Status: DC | PRN

## 2012-06-12 MED ORDER — ALPRAZOLAM 0.5 MG PO TABS: 0.5000 mg | ORAL_TABLET | Freq: Every evening | ORAL | Status: DC | PRN

## 2012-06-12 MED ORDER — HYDROMORPHONE HCL PF 1 MG/ML IJ SOLN
INTRAMUSCULAR | Status: AC
  Administered 2012-06-12: 0.5 mg
  Filled 2012-06-12: qty 1

## 2012-06-12 MED ORDER — HYDROMORPHONE HCL PF 1 MG/ML IJ SOLN
0.5000 mg | INTRAMUSCULAR | Status: DC | PRN
  Administered 2012-06-12 (×2): 1 mg via INTRAVENOUS
  Filled 2012-06-12 (×2): qty 1

## 2012-06-12 NOTE — Discharge Summary (Signed)
Physician Discharge Summary  Patient ID: Eduardo Potts MRN: [id number] DOB/AGE: 1976-05-01 36 y.o.  Admit date: 06/11/2012 Discharge date: 06/12/2012  Admission Diagnoses: Evolving compartment syndrome right forearm, s/p crush injury  Discharge Diagnoses:  Same, improved  Discharged Condition: Improved  Hospital Course: Pt. Pleasant 36 yo male who presented to [facility] for evaluation of his right upper extremity. He sustained a crush injury Monday to the right forearm. The patient noted increasing pain, numbness of the fingers, and swelling of the forearm. Evaluation was concerning for evolving compartment syndrome, thus patient was admitted and underwent fasciotomies to the right forearm. Please see operative report for full details.  Patient was admitted for IV pain management, antibiotics, and close observation. He did extremely well and had no postop complications. He had noted improvement in pain and numbness about the digits. He was tolerating a regular diet and voiding without difficulties. The decision was made to discharge him home.  Consults: None  Treatments: See operative report  Discharge Exam: Blood pressure 134/74, pulse 62, temperature 98.7 F (37.1 C), temperature source Oral, resp. rate 18, height 6\' 1"  (1.854 m), weight 94.2 kg (207 lb 10.8 oz), SpO2 96.00%. Marland Kitchen.The patient is alert and oriented in no acute distress the patient complains of pain in the affected upper extremity. The patient is noted to have a normal HEENT exam. Lung fields show equal chest expansion and no shortness of breath abdomen exam is nontender without distention. Lower extremity examination does not show any fracture dislocation or blood clot symptoms. Pelvis is stable neck and back are stable and nontender The right upper extremity splint is intact, digital rom intact, gross sensation intact. Radial, ulnar, median nerve intact  Disposition: 01-Home or Self Care  Discharge  Orders    Future Orders Please Complete By Expires   Diet - low sodium heart healthy      Call MD / Call 911      Comments:   If you experience chest pain or shortness of breath, CALL 911 and be transported to the hospital emergency room.  If you develope a fever above 101 F, pus (white drainage) or increased drainage or redness at the wound, or calf pain, call your surgeon's office.   Constipation Prevention      Comments:   Drink plenty of fluids.  Prune juice may be helpful.  You may use a stool softener, such as Colace (over the counter) 100 mg twice a day.  Use MiraLax (over the counter) for constipation as needed.   Increase activity slowly as tolerated      Discharge instructions      Comments:   Marland KitchenMarland KitchenKeep bandage clean and dry.  Call for any problems.  No smoking.  Criteria for driving a car: you should be off your pain medicine for 7-8 hours, able to drive one handed(confident), thinking clearly and feeling able in your judgement to drive. Continue elevation as it will decrease swelling.  If instructed by MD move your fingers within the confines of the bandage/splint.  Use ice if instructed by your MD. Call immediately for any sudden loss of feeling in your hand/arm or change in functional abilities of the extremity. Marland Kitchen.We recommend that you to take vitamin C 1000 mg a day to promote healing we also recommend that if you require her pain medicine that he take a stool softener to prevent constipation as most pain medicines will have constipation side effects. We recommend either Peri-Colace or Senokot and recommend that you also  consider adding MiraLAX to prevent the constipation affects from pain medicine if you are required to use them. These medicines are over the counter and maybe purchased at a local pharmacy.       Medication List     As of 06/12/2012  5:13 PM    STOP taking these medications         HYDROcodone-acetaminophen 5-325 MG per tablet   Commonly known as: NORCO/VICODIN       TAKE these medications         ALPRAZolam 0.5 MG tablet   Commonly known as: XANAX   Take 1 tablet (0.5 mg total) by mouth at bedtime as needed for sleep or anxiety.      methocarbamol 500 MG tablet   Commonly known as: ROBAXIN   Take 1 tablet (500 mg total) by mouth every 6 (six) hours as needed.         SignedSheran Lawless 06/12/2012, 5:13 PM

## 2012-06-12 NOTE — Progress Notes (Signed)
Patient provided with discharge instructions and follow up information. He is in stable condition and will be going home with his wife.

## 2012-06-12 NOTE — Op Note (Signed)
NAMEMarland Kitchen  Eduardo Potts, Eduardo Potts NO.:  [id number]  MEDICAL RECORD NO.:  [id number]  LOCATION:  5N02C                        FACILITY:  [facility]  PHYSICIAN:  Dionne Ano. Ilan Kahrs, M.D.DATE OF BIRTH:  22-Jan-1976  DATE OF PROCEDURE: DATE OF DISCHARGE:                              OPERATIVE REPORT   PREOPERATIVE DIAGNOSIS:  Compartment syndrome, right forearm.  POSTOPERATIVE DIAGNOSIS:  Compartment syndrome, right forearm.  PROCEDURE:  Fasciotomy, volar superficial and deep and dorsal compartments, right forearm.  SURGEON:  Dionne Ano. Amanda Pea, M.D.  ASSISTANT:  None.  COMPLICATION:  None.  ANESTHESIA:  General.  TOURNIQUET TIME:  Less than 20 minutes.  DRAINS:  3.  INDICATIONS:  The patient is a pleasant male who had his arm sandwiched between a tree box and a tree.  This happened 48 hours ago.  Since that time, he has had evolving compartmental phenomenon including pain, paresthesias, difficulty with flexion and extension, and worsening symptomatology.  I have examined him in my office today and felt that certainly he needed to proceed with the avenue of care, which is compartmental release to try and get all the pressure off of his neurovascular structures and muscles.  OPERATIVE PROCEDURE:  The patient was seen by myself and anesthesia, taken to the operative suite.  Arm was marked.  Pre and postop checklist were completed.  He was prepped and draped in usual sterile fashion with Betadine scrub and paint.  After 10 minutes of surgical Betadine scrub, we then elevated the tourniquet.  I made a 3-4-inch incision about the midline volar form region, dissected down and performed compartmental release.  I specifically released all of the superficial compartments including FCU, FCR, and FDS.  I then dissected deep and made sure that the profundus muscle belly was nicely released also.  Following fasciotomy, I then turned attention dorsally where I performed an extensor  apparatus fasciotomy, this was done through a 1.5- to 2-inch incision followed by releasing of the fascia.  I was very careful in sleep, blunt-tip scissor tips proximal and distal to the incisions to make sure he had full release about the entire form.  Given the preoperative examination, I did not feel that the carpal tunnel release would be necessary and did not perform this.  The patient tolerated this well.  Tourniquet was deflated.  Arm picked up beautifully.  There were no complicating features.  I placed drains and did close him in loose fashion with Prolene.  Compartments were very soft. Everything looked quite well and I was pleased with the findings.  I discussed this with he and his family, and admitted overnight for IV antibiotics, actually discharged him with more.  Do's and don'ts have been discussed and all questions have been encouraged and answered.     Dionne Ano. Amanda Pea, M.D.     [facility]  D:  06/11/2012  T:  06/12/2012  Job:  [id number]

## 2012-06-12 NOTE — Progress Notes (Signed)
There were no SCD's or foot pumps in the hospital available. Pt has been very ambulatory, walking around in room to the bathroom multiple times. Instructed to ambulate as much as possible.

## 2012-07-14 ENCOUNTER — Emergency Department (HOSPITAL_BASED_OUTPATIENT_CLINIC_OR_DEPARTMENT_OTHER)
Admission: EM | Admit: 2012-07-14 | Discharge: 2012-07-14 | Disposition: A | Discharge status: Home / Self Care | Payer: 59 | Attending: Emergency Medicine | Admitting: Emergency Medicine

## 2012-07-14 ENCOUNTER — Emergency Department (HOSPITAL_BASED_OUTPATIENT_CLINIC_OR_DEPARTMENT_OTHER): Payer: 59

## 2012-07-14 ENCOUNTER — Encounter (HOSPITAL_BASED_OUTPATIENT_CLINIC_OR_DEPARTMENT_OTHER): Payer: Self-pay | Admitting: *Deleted

## 2012-07-14 DIAGNOSIS — Y929 Unspecified place or not applicable: Secondary | ICD-10-CM | POA: Insufficient documentation

## 2012-07-14 DIAGNOSIS — W312XXA Contact with powered woodworking and forming machines, initial encounter: Secondary | ICD-10-CM | POA: Insufficient documentation

## 2012-07-14 DIAGNOSIS — IMO0002 Reserved for concepts with insufficient information to code with codable children: Secondary | ICD-10-CM

## 2012-07-14 DIAGNOSIS — Z79899 Other long term (current) drug therapy: Secondary | ICD-10-CM | POA: Insufficient documentation

## 2012-07-14 DIAGNOSIS — S81009A Unspecified open wound, unspecified knee, initial encounter: Secondary | ICD-10-CM | POA: Insufficient documentation

## 2012-07-14 DIAGNOSIS — Y9389 Activity, other specified: Secondary | ICD-10-CM | POA: Insufficient documentation

## 2012-07-14 MED ORDER — IBUPROFEN 600 MG PO TABS: 600.0000 mg | ORAL_TABLET | Freq: Four times a day (QID) | ORAL | Status: DC | PRN

## 2012-07-14 MED ORDER — LIDOCAINE-EPINEPHRINE 2 %-1:100000 IJ SOLN
INTRAMUSCULAR | Status: AC
  Administered 2012-07-14: 1 mL
  Filled 2012-07-14: qty 1

## 2012-07-14 MED ORDER — SULFAMETHOXAZOLE-TRIMETHOPRIM 800-160 MG PO TABS: 1.0000 | ORAL_TABLET | Freq: Two times a day (BID) | ORAL | Status: DC

## 2012-07-14 MED ORDER — CEFAZOLIN SODIUM 1-5 GM-% IV SOLN
1.0000 g | Freq: Once | INTRAVENOUS | Status: AC
  Administered 2012-07-14: 1 g via INTRAVENOUS
  Filled 2012-07-14: qty 50

## 2012-07-14 MED ORDER — CEPHALEXIN 500 MG PO CAPS: 500.0000 mg | ORAL_CAPSULE | Freq: Four times a day (QID) | ORAL | Status: DC

## 2012-07-14 MED ORDER — IBUPROFEN 200 MG PO TABS
ORAL_TABLET | ORAL | Status: AC
  Administered 2012-07-14: 600 mg
  Filled 2012-07-14: qty 3

## 2012-07-14 MED ORDER — SODIUM CHLORIDE 0.9 % IV SOLN: INTRAVENOUS | Status: DC

## 2012-07-14 NOTE — ED Notes (Signed)
MD at bedside. 

## 2012-07-14 NOTE — ED Notes (Signed)
Laceration to his left knee with a chain saw. Ambulatory.

## 2012-07-14 NOTE — ED Provider Notes (Signed)
Medical screening examination/treatment/procedure(s) were performed by non-physician practitioner and as supervising physician I was immediately available for consultation/collaboration.   Pt with a complex laceration to the right knee from a saw. He has 3 or 4 cuts. The couple of lacerations are deep, and patient had 3 deep sutures placed, absorbables. No fracture per Xray, and ROM of the knee was normal. Pt got a dose of ancef in the ED, he is UTD with tetanus.  We discussed the risk of infection. i extensively cleaned the wound with lavage NS and betadine.   LACERATION REPAIR Date/Time: 07/14/2012 4:54 PM Performed by: Derwood Kaplan Authorized by: Derwood Kaplan Consent: Verbal consent obtained. Risks and benefits: risks, benefits and alternatives were discussed Consent given by: patient Patient understanding: patient states understanding of the procedure being performed Patient identity confirmed: verbally with patient Body area: lower extremity Location details: right knee Laceration length: 25 cm Contamination: The wound is contaminated. Foreign bodies: unknown Tendon involvement: none Nerve involvement: superficial Vascular damage: no Anesthesia: local infiltration Local anesthetic: lidocaine 2% with epinephrine Anesthetic total: 10 ml Patient sedated: no Preparation: Patient was prepped and draped in the usual sterile fashion. Irrigation solution: saline Irrigation method: jet lavage Amount of cleaning: extensive Debridement: minimal Degree of undermining: minimal Skin closure: 3-0 nylon and 4-0 nylon Mucous membrane closure: 4-0 Chromic gut Number of sutures: 23 Technique: simple and horizontal mattress Approximation: close Approximation difficulty: complex Dressing: antibiotic ointment Patient tolerance: Patient tolerated the procedure well with no immediate complications.    Derwood Kaplan, MD 07/14/12 1706

## 2012-07-14 NOTE — ED Provider Notes (Signed)
History     CSN: [id number]  Arrival date & time 07/14/12  1412   First MD Initiated Contact with Patient 07/14/12 1428      Chief Complaint  Patient presents with  . Laceration    (Consider location/radiation/quality/duration/timing/severity/associated sxs/prior treatment) Patient is a 37 y.o. male presenting with skin laceration. The history is provided by the patient. No language interpreter was used.  Laceration  The incident occurred less than 1 hour ago. Pain location: R knee. The laceration is 10 cm in size. Injury mechanism: Chain saw. The pain is at a severity of 4/10. The pain is moderate. The pain has been constant since onset. He reports no foreign bodies present. His tetanus status is UTD.   37 year old male coming in with a chain saw accident to his left knee. He states that the chain saw bounced off a tree and into his knee. Ligaments and bone exposed. Bleeding controlled. Multiple small lacerations surrounding the major 10 cm injury. Tetanus up to date.   History reviewed. No pertinent past medical history.  Past Surgical History  Procedure Date  . Arm   . Hip surgery     left  . Thumb surgery   . Knee surgery     right knee  . Fasciotomy 06/11/2012    Procedure: FASCIOTOMY;  Surgeon: Dominica Severin, MD;  Location: [facility] OR;  Service: Orthopedics;  Laterality: Right;    No family history on file.  History  Substance Use Topics  . Smoking status: Never Smoker   . Smokeless tobacco: Not on file  . Alcohol Use: No      Review of Systems  Constitutional: Negative.   HENT: Negative.   Eyes: Negative.   Respiratory: Negative.   Cardiovascular: Negative.   Gastrointestinal: Negative.   Musculoskeletal:       Left knee laceration injury  Neurological: Negative.   Psychiatric/Behavioral: Negative.   All other systems reviewed and are negative.    Allergies  Morphine and related  Home Medications   Current Outpatient Rx  Name  Route  Sig  Dispense   Refill  . ALPRAZOLAM 0.5 MG PO TABS   Oral   Take 1 tablet (0.5 mg total) by mouth at bedtime as needed for sleep or anxiety.   30 tablet   0   . METHOCARBAMOL 500 MG PO TABS   Oral   Take 1 tablet (500 mg total) by mouth every 6 (six) hours as needed.   30 tablet   0     There were no vitals taken for this visit.  Physical Exam  Nursing note and vitals reviewed. Constitutional: He is oriented to person, place, and time. He appears well-developed and well-nourished.  HENT:  Head: Normocephalic.  Eyes: Conjunctivae normal and EOM are normal. Pupils are equal, round, and reactive to light.  Neck: Normal range of motion. Neck supple.  Cardiovascular: Normal rate.   Pulmonary/Chest: Effort normal.  Abdominal: Soft.  Musculoskeletal: Normal range of motion.  Neurological: He is alert and oriented to person, place, and time.  Skin: Skin is warm and dry.       Left knee laceration from chain saw injury. Bone and ligaments exposed. Multiple small lacerations surrounding the large 10 cm laceration. +CMS below injury  Psychiatric: He has a normal mood and affect.    ED Course  Procedures (including critical care time) Dr Rhunette Croft in to see patient.  He will repair the complex laceration to the L knee Labs Reviewed - No  data to display No results found.   No diagnosis found.    MDM          Remi Haggard, NP 07/15/12 1445  Remi Haggard, NP 07/15/12 1446

## 2012-07-17 NOTE — ED Provider Notes (Signed)
Medical screening examination/treatment/procedure(s) were conducted as a shared visit with non-physician practitioner(s) and myself.  I personally evaluated the patient during the encounter  DETAILED LAC REPAIR NOTE IS SEPARATE  Derwood Kaplan, MD 07/17/12 1312

## 2012-07-18 ENCOUNTER — Encounter (HOSPITAL_COMMUNITY)
Admission: AD | Disposition: A | Discharge status: Home / Self Care | Payer: Self-pay | Source: Ambulatory Visit | Attending: Orthopedic Surgery

## 2012-07-18 ENCOUNTER — Encounter (HOSPITAL_COMMUNITY): Payer: Self-pay | Admitting: Anesthesiology

## 2012-07-18 ENCOUNTER — Inpatient Hospital Stay (HOSPITAL_COMMUNITY): Payer: 59 | Admitting: Anesthesiology

## 2012-07-18 ENCOUNTER — Inpatient Hospital Stay (HOSPITAL_COMMUNITY)
Admission: AD | Admit: 2012-07-18 | Discharge: 2012-07-21 | DRG: 902 | Disposition: A | Discharge status: Home / Self Care | Payer: 59 | Source: Ambulatory Visit | Attending: Orthopedic Surgery | Admitting: Orthopedic Surgery

## 2012-07-18 ENCOUNTER — Other Ambulatory Visit: Payer: Self-pay | Admitting: Orthopedic Surgery

## 2012-07-18 ENCOUNTER — Encounter (HOSPITAL_COMMUNITY): Payer: Self-pay | Admitting: General Practice

## 2012-07-18 DIAGNOSIS — W298XXA Contact with other powered powered hand tools and household machinery, initial encounter: Secondary | ICD-10-CM | POA: Diagnosis present

## 2012-07-18 DIAGNOSIS — L02419 Cutaneous abscess of limb, unspecified: Secondary | ICD-10-CM | POA: Diagnosis present

## 2012-07-18 DIAGNOSIS — S81809A Unspecified open wound, unspecified lower leg, initial encounter: Principal | ICD-10-CM | POA: Diagnosis present

## 2012-07-18 DIAGNOSIS — S81009A Unspecified open wound, unspecified knee, initial encounter: Principal | ICD-10-CM | POA: Diagnosis present

## 2012-07-18 DIAGNOSIS — Y929 Unspecified place or not applicable: Secondary | ICD-10-CM

## 2012-07-18 DIAGNOSIS — L03116 Cellulitis of left lower limb: Secondary | ICD-10-CM

## 2012-07-18 DIAGNOSIS — Z885 Allergy status to narcotic agent status: Secondary | ICD-10-CM

## 2012-07-18 DIAGNOSIS — Z792 Long term (current) use of antibiotics: Secondary | ICD-10-CM

## 2012-07-18 HISTORY — PX: I & D EXTREMITY: SHX5045

## 2012-07-18 LAB — GENTAMICIN LEVEL, RANDOM: Gentamicin Rm: 2.1 ug/mL

## 2012-07-18 SURGERY — IRRIGATION AND DEBRIDEMENT EXTREMITY
Anesthesia: General | Site: Knee | Laterality: Left | Wound class: Dirty or Infected

## 2012-07-18 MED ORDER — ONDANSETRON HCL 4 MG/2ML IJ SOLN: 4.0000 mg | Freq: Four times a day (QID) | INTRAMUSCULAR | Status: DC | PRN

## 2012-07-18 MED ORDER — SODIUM CHLORIDE 0.9 % IV SOLN
3.0000 g | Freq: Four times a day (QID) | INTRAVENOUS | Status: DC
  Administered 2012-07-18 – 2012-07-21 (×12): 3 g via INTRAVENOUS
  Filled 2012-07-18 (×14): qty 3

## 2012-07-18 MED ORDER — LACTATED RINGERS IV SOLN
INTRAVENOUS | Status: DC
  Administered 2012-07-18 (×2): via INTRAVENOUS
  Administered 2012-07-19: 50 mL/h via INTRAVENOUS

## 2012-07-18 MED ORDER — HYDROMORPHONE HCL PF 1 MG/ML IJ SOLN
0.2500 mg | INTRAMUSCULAR | Status: DC | PRN
  Administered 2012-07-18 (×3): 0.5 mg via INTRAVENOUS

## 2012-07-18 MED ORDER — SODIUM CHLORIDE 0.9 % IR SOLN
Status: DC | PRN
  Administered 2012-07-18: 3000 mL

## 2012-07-18 MED ORDER — GENTAMICIN SULFATE 40 MG/ML IJ SOLN
650.0000 mg | INTRAVENOUS | Status: DC
  Administered 2012-07-18 – 2012-07-20 (×3): 650 mg via INTRAVENOUS
  Filled 2012-07-18 (×4): qty 16.25

## 2012-07-18 MED ORDER — FENTANYL CITRATE 0.05 MG/ML IJ SOLN
INTRAMUSCULAR | Status: DC | PRN
  Administered 2012-07-18 (×5): 50 ug via INTRAVENOUS

## 2012-07-18 MED ORDER — PROPOFOL 10 MG/ML IV BOLUS
INTRAVENOUS | Status: DC | PRN
  Administered 2012-07-18: 200 mg via INTRAVENOUS

## 2012-07-18 MED ORDER — SODIUM CHLORIDE 0.9 % IR SOLN
Status: DC | PRN
  Administered 2012-07-18: 16:00:00

## 2012-07-18 MED ORDER — OXYCODONE-ACETAMINOPHEN 5-325 MG PO TABS
1.0000 | ORAL_TABLET | ORAL | Status: DC | PRN
  Administered 2012-07-18 – 2012-07-19 (×5): 2 via ORAL
  Administered 2012-07-20 (×2): 1 via ORAL
  Administered 2012-07-20 – 2012-07-21 (×3): 2 via ORAL
  Administered 2012-07-21 (×2): 1 via ORAL
  Filled 2012-07-18 (×6): qty 2
  Filled 2012-07-18: qty 1
  Filled 2012-07-18 (×4): qty 2

## 2012-07-18 MED ORDER — LIDOCAINE HCL (CARDIAC) 20 MG/ML IV SOLN
INTRAVENOUS | Status: DC | PRN
  Administered 2012-07-18: 40 mg via INTRAVENOUS

## 2012-07-18 MED ORDER — METHOCARBAMOL 500 MG PO TABS
500.0000 mg | ORAL_TABLET | Freq: Four times a day (QID) | ORAL | Status: DC | PRN
  Administered 2012-07-18 – 2012-07-21 (×6): 500 mg via ORAL
  Filled 2012-07-18 (×7): qty 1

## 2012-07-18 MED ORDER — CHLORHEXIDINE GLUCONATE 4 % EX LIQD
60.0000 mL | Freq: Once | CUTANEOUS | Status: AC
  Administered 2012-07-18: 4 via TOPICAL
  Filled 2012-07-18: qty 60

## 2012-07-18 MED ORDER — ONDANSETRON HCL 4 MG/2ML IJ SOLN
INTRAMUSCULAR | Status: DC | PRN
  Administered 2012-07-18: 4 mg via INTRAVENOUS

## 2012-07-18 MED ORDER — HYDROMORPHONE HCL PF 1 MG/ML IJ SOLN
1.0000 mg | INTRAMUSCULAR | Status: DC | PRN
  Administered 2012-07-18 – 2012-07-19 (×7): 1 mg via INTRAVENOUS
  Filled 2012-07-18 (×7): qty 1

## 2012-07-18 MED ORDER — ALPRAZOLAM 0.5 MG PO TABS
0.5000 mg | ORAL_TABLET | Freq: Every evening | ORAL | Status: DC | PRN
  Administered 2012-07-18: 0.5 mg via ORAL
  Filled 2012-07-18: qty 1

## 2012-07-18 MED ORDER — LACTATED RINGERS IV SOLN
INTRAVENOUS | Status: DC | PRN
  Administered 2012-07-18: 16:00:00 via INTRAVENOUS

## 2012-07-18 MED ORDER — MIDAZOLAM HCL 5 MG/5ML IJ SOLN
INTRAMUSCULAR | Status: DC | PRN
  Administered 2012-07-18: 2 mg via INTRAVENOUS

## 2012-07-18 MED ORDER — HYDROMORPHONE HCL PF 1 MG/ML IJ SOLN
INTRAMUSCULAR | Status: AC
  Filled 2012-07-18: qty 1

## 2012-07-18 SURGICAL SUPPLY — 54 items
BAG DECANTER FOR FLEXI CONT (MISCELLANEOUS) ×1 IMPLANT
BANDAGE ELASTIC 4 VELCRO ST LF (GAUZE/BANDAGES/DRESSINGS) ×1 IMPLANT
BANDAGE GAUZE ELAST BULKY 4 IN (GAUZE/BANDAGES/DRESSINGS) ×2 IMPLANT
BNDG CMPR MED 10X6 ELC LF (GAUZE/BANDAGES/DRESSINGS) ×1
BNDG COHESIVE 4X5 TAN STRL (GAUZE/BANDAGES/DRESSINGS) ×1 IMPLANT
BNDG ELASTIC 6X10 VLCR STRL LF (GAUZE/BANDAGES/DRESSINGS) ×1 IMPLANT
BRUSH SCRUB DISP (MISCELLANEOUS) ×2 IMPLANT
CLOTH BEACON ORANGE TIMEOUT ST (SAFETY) ×2 IMPLANT
COVER SURGICAL LIGHT HANDLE (MISCELLANEOUS) ×2 IMPLANT
CUFF TOURNIQUET SINGLE 18IN (TOURNIQUET CUFF) ×1 IMPLANT
CUFF TOURNIQUET SINGLE 24IN (TOURNIQUET CUFF) IMPLANT
CUFF TOURNIQUET SINGLE 34IN LL (TOURNIQUET CUFF) ×1 IMPLANT
CUFF TOURNIQUET SINGLE 44IN (TOURNIQUET CUFF) IMPLANT
DRAPE U-SHAPE 47X51 STRL (DRAPES) ×2 IMPLANT
DRSG ADAPTIC 3X8 NADH LF (GAUZE/BANDAGES/DRESSINGS) ×1 IMPLANT
DRSG PAD ABDOMINAL 8X10 ST (GAUZE/BANDAGES/DRESSINGS) ×4 IMPLANT
DURAPREP 26ML APPLICATOR (WOUND CARE) ×2 IMPLANT
ELECT REM PT RETURN 9FT ADLT (ELECTROSURGICAL) ×2
ELECTRODE REM PT RTRN 9FT ADLT (ELECTROSURGICAL) IMPLANT
FACESHIELD LNG OPTICON STERILE (SAFETY) ×1 IMPLANT
GLOVE BIOGEL PI ORTHO PRO 7.5 (GLOVE)
GLOVE BIOGEL PI ORTHO PRO SZ8 (GLOVE) ×1
GLOVE ORTHO TXT STRL SZ7.5 (GLOVE) ×1 IMPLANT
GLOVE PI ORTHO PRO STRL 7.5 (GLOVE) ×1 IMPLANT
GLOVE PI ORTHO PRO STRL SZ8 (GLOVE) ×1 IMPLANT
GLOVE SURG ORTHO 8.5 STRL (GLOVE) ×2 IMPLANT
GOWN STRL NON-REIN LRG LVL3 (GOWN DISPOSABLE) ×2 IMPLANT
GOWN STRL REIN XL XLG (GOWN DISPOSABLE) ×3 IMPLANT
HANDPIECE INTERPULSE COAX TIP (DISPOSABLE) ×2
IMMOBILIZER KNEE 22 UNIV (SOFTGOODS) ×1 IMPLANT
KIT BASIN OR (CUSTOM PROCEDURE TRAY) ×2 IMPLANT
KIT ROOM TURNOVER OR (KITS) ×2 IMPLANT
MANIFOLD NEPTUNE II (INSTRUMENTS) ×2 IMPLANT
NS IRRIG 1000ML POUR BTL (IV SOLUTION) ×2 IMPLANT
PACK ORTHO EXTREMITY (CUSTOM PROCEDURE TRAY) ×2 IMPLANT
PAD ARMBOARD 7.5X6 YLW CONV (MISCELLANEOUS) ×3 IMPLANT
PENCIL BUTTON HOLSTER BLD 10FT (ELECTRODE) IMPLANT
SET HNDPC FAN SPRY TIP SCT (DISPOSABLE) IMPLANT
SPONGE GAUZE 4X4 12PLY (GAUZE/BANDAGES/DRESSINGS) ×1 IMPLANT
SPONGE LAP 18X18 X RAY DECT (DISPOSABLE) ×2 IMPLANT
SPONGE LAP 4X18 X RAY DECT (DISPOSABLE) ×1 IMPLANT
STOCKINETTE IMPERVIOUS 9X36 MD (GAUZE/BANDAGES/DRESSINGS) ×2 IMPLANT
SUT ETHILON 2 0 FS 18 (SUTURE) IMPLANT
SUT ETHILON 2 0 PSLX (SUTURE) ×2 IMPLANT
SUT VIC AB 2-0 CT1 27 (SUTURE)
SUT VIC AB 2-0 CT1 TAPERPNT 27 (SUTURE) IMPLANT
TOWEL OR 17X24 6PK STRL BLUE (TOWEL DISPOSABLE) ×2 IMPLANT
TOWEL OR 17X26 10 PK STRL BLUE (TOWEL DISPOSABLE) ×2 IMPLANT
TUBE ANAEROBIC SPECIMEN COL (MISCELLANEOUS) IMPLANT
TUBE CONNECTING 12X1/4 (SUCTIONS) ×2 IMPLANT
UNDERPAD 30X30 INCONTINENT (UNDERPADS AND DIAPERS) ×1 IMPLANT
WATER STERILE IRR 1000ML POUR (IV SOLUTION) ×1 IMPLANT
WRAP KNEE MAXI GEL POST OP (GAUZE/BANDAGES/DRESSINGS) ×1 IMPLANT
YANKAUER SUCT BULB TIP NO VENT (SUCTIONS) ×2 IMPLANT

## 2012-07-18 NOTE — Anesthesia Preprocedure Evaluation (Addendum)
Anesthesia Evaluation  Patient identified by MRN, date of birth, ID band Patient awake    Reviewed: Allergy & Precautions, H&P , NPO status , Patient's Chart, lab work & pertinent test results  History of Anesthesia Complications Negative for: history of anesthetic complications  Airway Mallampati: II TM Distance: >3 FB Neck ROM: Full    Dental No notable dental hx. (+) Teeth Intact and Dental Advisory Given   Pulmonary neg pulmonary ROS,  breath sounds clear to auscultation  Pulmonary exam normal       Cardiovascular negative cardio ROS  Rhythm:Regular Rate:Normal     Neuro/Psych negative neurological ROS  negative psych ROS   GI/Hepatic negative GI ROS, Neg liver ROS,   Endo/Other  negative endocrine ROS  Renal/GU negative Renal ROS  negative genitourinary   Musculoskeletal negative musculoskeletal ROS (+)   Abdominal   Peds  Hematology negative hematology ROS (+)   Anesthesia Other Findings   Reproductive/Obstetrics negative OB ROS                          Anesthesia Physical Anesthesia Plan  ASA: I  Anesthesia Plan: General   Post-op Pain Management:    Induction: Intravenous  Airway Management Planned: LMA  Additional Equipment:   Intra-op Plan:   Post-operative Plan: Extubation in OR  Informed Consent: I have reviewed the patients History and Physical, chart, labs and discussed the procedure including the risks, benefits and alternatives for the proposed anesthesia with the patient or authorized representative who has indicated his/her understanding and acceptance.   Dental advisory given  Plan Discussed with: CRNA  Anesthesia Plan Comments:         Anesthesia Quick Evaluation

## 2012-07-18 NOTE — Preoperative (Signed)
Beta Blockers   Reason not to administer Beta Blockers:Not Applicable 

## 2012-07-18 NOTE — Interval H&P Note (Signed)
History and Physical Interval Note:  07/18/2012 3:09 PM  Eduardo Potts  has presented today for surgery, with the diagnosis of l knee infection  The various methods of treatment have been discussed with the patient and family. After consideration of risks, benefits and other options for treatment, the patient has consented to  Procedure(s) (LRB) with comments: IRRIGATION AND DEBRIDEMENT EXTREMITY (Left) - I & D L knee as a surgical intervention .  The patient's history has been reviewed, patient examined, no change in status, stable for surgery.  I have reviewed the patient's chart and labs.  Questions were answered to the patient's satisfaction.     Eduardo Potts,STEVEN R

## 2012-07-18 NOTE — Brief Op Note (Signed)
07/18/2012  4:59 PM  PATIENT:  Eduardo Potts  37 y.o. male  PRE-OPERATIVE DIAGNOSIS:  left knee infection, s/p chainsaw injury  POST-OPERATIVE DIAGNOSIS:  left knee infection, s/p chainsaw injury  PROCEDURE:  Procedure(s) (LRB) with comments: IRRIGATION AND DEBRIDEMENT EXTREMITY (Left) - I & D Left knee   SURGEON:  Surgeon(s) and Role:    * Verlee Rossetti, MD - Primary  PHYSICIAN ASSISTANT:   ASSISTANTS: none   ANESTHESIA:   general  EBL:  Total I/O In: 600 [I.V.:600] Out: -   BLOOD ADMINISTERED:none  DRAINS: none   LOCAL MEDICATIONS USED:  NONE  SPECIMEN:  No Specimen  DISPOSITION OF SPECIMEN:  N/A  COUNTS:  YES  TOURNIQUET:  * No tourniquets in log *  DICTATION: .Other Dictation: Dictation Number [id number]  PLAN OF CARE: Admit to inpatient   PATIENT DISPOSITION:  PACU - hemodynamically stable.   Delay start of Pharmacological VTE agent (>24hrs) due to surgical blood loss or risk of bleeding: not applicable

## 2012-07-18 NOTE — H&P (View-Only) (Signed)
Eduardo Potts is an 37 y.o. male.   Chief Complaint: cut my leg HPI: Patient is a pleasant 37 yo male who presents for evaluation of his left lower extremity. Patient sustained a laceration to the left knee with a chainsaw this past Monday. He was seen and evaluated at [facility] and underwent I&D and received IV abx. The patient was released on keflex and bactrim. The patient unfortunately has had increased pain, redness and swelling.  He is seen and evaluated in our office today with signs of ascending cellulitis and poor wound conditions.  No past medical history on file.  Past Surgical History  Procedure Date  . Arm   . Hip surgery     left  . Thumb surgery   . Knee surgery     right knee  . Fasciotomy 06/11/2012    Procedure: FASCIOTOMY;  Surgeon: Dominica Severin, MD;  Location: [facility] OR;  Service: Orthopedics;  Laterality: Right;    No family history on file. Social History:  reports that he has never smoked. He does not have any smokeless tobacco history on file. He reports that he does not drink alcohol or use illicit drugs.  Allergies:  Allergies  Allergen Reactions  . Morphine And Related Itching     (Not in a hospital admission)  No results found for this or any previous visit (from the past 48 hour(s)). No results found.  Review of Systems  Constitutional: Negative.   HENT: Negative.   Eyes: Negative.   Respiratory: Negative.   Cardiovascular: Negative.   Gastrointestinal: Negative.   Genitourinary: Negative.   Musculoskeletal:       See hpi  Skin: Negative.   Neurological: Negative.   Endo/Heme/Allergies: Negative.   Psychiatric/Behavioral: Negative.     There were no vitals taken for this visit. Physical Exam  Constitutional: He is oriented to person, place, and time. He appears well-developed and well-nourished. No distress.  HENT:  Head: Normocephalic and atraumatic.  Eyes: EOM are normal.  Neck: Neck supple.  Respiratory: Effort  normal and breath sounds normal. No respiratory distress. He has no wheezes. He exhibits no tenderness.  GI: Soft.  Musculoskeletal:       Left lower extremity reveals lacerations about the left knee infrapatellar region with surrounding erythema and ascending cellulitis to proximal medial thigh, small laceration about the suprapatellar region noted. Patient able to elicit straight leg raise with some degree of pain Pedal pulses intact, digits pink warm and dry  Neurological: He is alert and oriented to person, place, and time.  Skin: Skin is warm and dry. He is not diaphoretic.  Psychiatric: He has a normal mood and affect.     Assessment/Plan  Left Knee laceration,chainsaw, with ascending cellulitis can not rule out partial patela-tendon injury We will admit the patient and initiate IV abx with formal I&D and repair of associated structures as necessary. Dr. Ranell Patrick will be managing his care.  Eduardo Potts L 07/18/2012, 9:23 AM

## 2012-07-18 NOTE — Progress Notes (Signed)
Aneta Mins RN here to receive report as primary

## 2012-07-18 NOTE — Progress Notes (Signed)
ANTIBIOTIC CONSULT NOTE - INITIAL  Pharmacy Consult for Unasyn and Gentamicin Indication: Infected laceration from chain saw to lt knee.  Allergies  Allergen Reactions  . Morphine And Related Itching    Patient Measurements: Height: 6' 0.83" (185 cm) Weight: 207 lb 10.8 oz (94.2 kg) IBW/kg (Calculated) : 79.52  Adjusted Body Weight:   Vital Signs:   Intake/Output from previous day:   Intake/Output from this shift:    Labs: No results found for this basename: WBC:3,HGB:3,PLT:3,LABCREA:3,CREATININE:3 in the last 72 hours Estimated Creatinine Clearance: 97.5 ml/min (by C-G formula based on Cr of 1.2). No results found for this basename: VANCOTROUGH:2,VANCOPEAK:2,VANCORANDOM:2,GENTTROUGH:2,GENTPEAK:2,GENTRANDOM:2,TOBRATROUGH:2,TOBRAPEAK:2,TOBRARND:2,AMIKACINPEAK:2,AMIKACINTROU:2,AMIKACIN:2, in the last 72 hours   Microbiology: No results found for this or any previous visit (from the past 720 hour(s)).  Medical History: No past medical history on file.  Medications:  Scheduled:    . ampicillin-sulbactam (UNASYN) IV  3 g Intravenous Q6H  . chlorhexidine  60 mL Topical Once  . gentamicin  650 mg Intravenous Q24H   Assessment: 37 yr old male cut his leg with a chain saw on Mon. Was seen at [facility] where knee was stitched up and he was given IV abx, then released on Keflex and Bactrim. Pt has developed ascending cellulitis and poor wound conditions.   Plan:  1) Unasyn 3 Gm IV q6h 2) Gentamicin 650 mg (7 mg/kg) q24 hr.  3) Check gent level in 9-10 hrs to verify dose interval.  Eduardo Potts 07/18/2012,11:09 AM

## 2012-07-18 NOTE — Anesthesia Postprocedure Evaluation (Signed)
  Anesthesia Post-op Note  Patient: Eduardo Potts  Procedure(s) Performed: Procedure(s) (LRB) with comments: IRRIGATION AND DEBRIDEMENT EXTREMITY (Left) - I & D Left knee   Patient Location: PACU  Anesthesia Type:General  Level of Consciousness: awake, alert  and oriented  Airway and Oxygen Therapy: Patient Spontanous Breathing and Patient connected to nasal cannula oxygen  Post-op Pain: mild  Post-op Assessment: Post-op Vital signs reviewed, Patient's Cardiovascular Status Stable, Respiratory Function Stable, Patent Airway and No signs of Nausea or vomiting  Post-op Vital Signs: Reviewed and stable  Complications: No apparent anesthesia complications

## 2012-07-18 NOTE — Anesthesia Procedure Notes (Signed)
Procedure Name: LMA Insertion Date/Time: 07/18/2012 4:10 PM Performed by: Margaree Mackintosh Pre-anesthesia Checklist: Patient identified, Timeout performed, Emergency Drugs available, Suction available and Patient being monitored Patient Re-evaluated:Patient Re-evaluated prior to inductionOxygen Delivery Method: Circle system utilized Preoxygenation: Pre-oxygenation with 100% oxygen Intubation Type: IV induction LMA: LMA inserted LMA Size: 5.0 Number of attempts: 1 Placement Confirmation: positive ETCO2 and breath sounds checked- equal and bilateral Tube secured with: Tape Dental Injury: Teeth and Oropharynx as per pre-operative assessment

## 2012-07-18 NOTE — Transfer of Care (Signed)
Immediate Anesthesia Transfer of Care Note  Patient: Eduardo Potts  Procedure(s) Performed: Procedure(s) (LRB) with comments: IRRIGATION AND DEBRIDEMENT EXTREMITY (Left) - I & D Left knee   Patient Location: PACU  Anesthesia Type:General  Level of Consciousness: awake, alert  and oriented  Airway & Oxygen Therapy: Patient Spontanous Breathing and Patient connected to nasal cannula oxygen  Post-op Assessment: Report given to PACU RN, Post -op Vital signs reviewed and stable and Patient moving all extremities  Post vital signs: Reviewed and stable  Complications: No apparent anesthesia complications

## 2012-07-18 NOTE — H&P (Signed)
Eduardo Potts is an 37 y.o. male.   Chief Complaint: cut my leg HPI: Patient is a pleasant 37 yo male who presents for evaluation of his left lower extremity. Patient sustained a laceration to the left knee with a chainsaw this past Monday. He was seen and evaluated at [place] urgent care and underwent I&D and received IV abx. The patient was released on keflex and bactrim. The patient unfortunately has had increased pain, redness and swelling.  He is seen and evaluated in our office today with signs of ascending cellulitis and poor wound conditions.  No past medical history on file.  Past Surgical History  Procedure Date  . Arm   . Hip surgery     left  . Thumb surgery   . Knee surgery     right knee  . Fasciotomy 06/11/2012    Procedure: FASCIOTOMY;  Surgeon: William Gramig, MD;  Location: [facility] OR;  Service: Orthopedics;  Laterality: Right;    No family history on file. Social History:  reports that he has never smoked. He does not have any smokeless tobacco history on file. He reports that he does not drink alcohol or use illicit drugs.  Allergies:  Allergies  Allergen Reactions  . Morphine And Related Itching     (Not in a hospital admission)  No results found for this or any previous visit (from the past 48 hour(s)). No results found.  Review of Systems  Constitutional: Negative.   HENT: Negative.   Eyes: Negative.   Respiratory: Negative.   Cardiovascular: Negative.   Gastrointestinal: Negative.   Genitourinary: Negative.   Musculoskeletal:       See hpi  Skin: Negative.   Neurological: Negative.   Endo/Heme/Allergies: Negative.   Psychiatric/Behavioral: Negative.     There were no vitals taken for this visit. Physical Exam  Constitutional: He is oriented to person, place, and time. He appears well-developed and well-nourished. No distress.  HENT:  Head: Normocephalic and atraumatic.  Eyes: EOM are normal.  Neck: Neck supple.  Respiratory: Effort  normal and breath sounds normal. No respiratory distress. He has no wheezes. He exhibits no tenderness.  GI: Soft.  Musculoskeletal:       Left lower extremity reveals lacerations about the left knee infrapatellar region with surrounding erythema and ascending cellulitis to proximal medial thigh, small laceration about the suprapatellar region noted. Patient able to elicit straight leg raise with some degree of pain Pedal pulses intact, digits pink warm and dry  Neurological: He is alert and oriented to person, place, and time.  Skin: Skin is warm and dry. He is not diaphoretic.  Psychiatric: He has a normal mood and affect.     Assessment/Plan  Left Knee laceration,chainsaw, with ascending cellulitis can not rule out partial patela-tendon injury We will admit the patient and initiate IV abx with formal I&D and repair of associated structures as necessary. Dr. Norris will be managing his care.  Vane Yapp L 07/18/2012, 9:23 AM    

## 2012-07-19 DIAGNOSIS — L03116 Cellulitis of left lower limb: Secondary | ICD-10-CM | POA: Diagnosis present

## 2012-07-19 LAB — CREATININE, SERUM: GFR calc non Af Amer: 77 mL/min — ABNORMAL LOW (ref >90)

## 2012-07-19 MED ORDER — OXYCODONE-ACETAMINOPHEN 5-325 MG PO TABS: 1.0000 | ORAL_TABLET | ORAL | Status: DC | PRN

## 2012-07-19 MED ORDER — DIPHENHYDRAMINE HCL 25 MG PO CAPS
25.0000 mg | ORAL_CAPSULE | Freq: Four times a day (QID) | ORAL | Status: DC | PRN
  Administered 2012-07-19 – 2012-07-21 (×2): 25 mg via ORAL
  Filled 2012-07-19 (×2): qty 1

## 2012-07-19 MED ORDER — MAGNESIUM HYDROXIDE 400 MG/5ML PO SUSP
30.0000 mL | Freq: Every day | ORAL | Status: DC | PRN
  Administered 2012-07-19: 30 mL via ORAL
  Filled 2012-07-19: qty 30

## 2012-07-19 MED ORDER — AMOXICILLIN-POT CLAVULANATE 500-125 MG PO TABS: 1.0000 | ORAL_TABLET | Freq: Three times a day (TID) | ORAL | Status: DC

## 2012-07-19 NOTE — Discharge Summary (Signed)
  Physician Discharge Summary   Patient ID: MALIKHI OGAN MRN: [id number] DOB/AGE: 1976/03/20 37 y.o.  Admit date: 07/18/2012 Discharge date: 07/19/2012  Admission Diagnoses:  L knee traumatic wound infection  Discharge Diagnoses:  Same   Surgeries: Procedure(s): IRRIGATION AND DEBRIDEMENT EXTREMITY on 07/18/2012   Consultants: none  Discharged Condition: Stable  Hospital Course: KAYA KLAUSING is an 37 y.o. male who was admitted 07/18/2012 with a chief complaint of left knee traumatic wound infection, and found to have a diagnosis of L knee cellulitis.  They were brought to the operating room on 07/18/2012 and underwent the above named procedures.    The patient had an uncomplicated hospital course and was stable for discharge.  Recent vital signs:  Filed Vitals:   07/19/12 0639  BP: 123/64  Pulse: 80  Temp: 99.2 F (37.3 C)  Resp: 18    Recent laboratory studies:  Results for orders placed during the hospital encounter of 07/18/12  MRSA PCR SCREENING      Component Value Range   MRSA by PCR NEGATIVE  NEGATIVE  GENTAMICIN LEVEL, RANDOM      Component Value Range   Gentamicin Rm 2.1    CREATININE, SERUM      Component Value Range   Creatinine, Ser 1.21  0.50 - 1.35 mg/dL   GFR calc non Af Amer 77 (*) >90 mL/min   GFR calc Af Amer 89 (*) >90 mL/min    Discharge Medications:     Medication List     As of 07/19/2012  8:00 AM    ASK your doctor about these medications         ALPRAZolam 0.5 MG tablet   Commonly known as: XANAX   Take 0.5 mg by mouth at bedtime as needed. For sleep or anxiety      cephALEXin 500 MG capsule   Commonly known as: KEFLEX   Take 1 capsule (500 mg total) by mouth 4 (four) times daily.      methocarbamol 500 MG tablet   Commonly known as: ROBAXIN   Take 500 mg by mouth every 6 (six) hours as needed. For muscle spasms      sulfamethoxazole-trimethoprim 800-160 MG per tablet   Commonly known as: BACTRIM DS,SEPTRA DS   Take 1 tablet by mouth 2 (two) times daily.        Diagnostic Studies: Dg Knee Complete 4 Views Left  07/14/2012  *RADIOLOGY REPORT*  Clinical Data: Recent injury with laceration  LEFT KNEE - COMPLETE 4+ VIEW  Comparison: None.  Findings: No acute fracture or dislocation is noted.  The large soft tissue laceration is noted in the infrapatellar region.  There may be compromise of the infrapatellar ligament.  No radiopaque foreign body is seen.  No other focal abnormality is noted.  IMPRESSION: Soft tissue injury below the patella as described.   Original Report Authenticated By: Alcide Clever, M.D.     Disposition: 01-Home or Self Care        Follow-up Information    Follow up with Dream Harman,STEVEN R, MD. Schedule an appointment as soon as possible for a visit in 1 week. [phone])    Contact information:   [address], [address] [place] [address] [phone]           Signed: Addis Tuohy,STEVEN R 07/19/2012, 8:00 AM

## 2012-07-19 NOTE — Progress Notes (Signed)
ANTIBIOTIC CONSULT NOTE - Follow Up   Pharmacy Consult for Unasyn and Gentamicin Indication: Infected laceration from chain saw to lt knee.  Allergies  Allergen Reactions  . Morphine And Related Itching and Rash    Patient Measurements: Height: 6\' 1"  (185.4 cm) Weight: 207 lb (93.895 kg) IBW/kg (Calculated) : 79.9  Adjusted Body Weight:   Vital Signs: Temp: 98.7 F (37.1 C) (01/24 2128) Temp src: Oral (01/24 2128) BP: 115/65 mmHg (01/24 2128) Pulse Rate: 79  (01/24 2128) Intake/Output from previous day: 01/24 0701 - 01/25 0700 In: 940 [P.O.:240; I.V.:700] Out: 15 [Blood:15] Intake/Output from this shift: Total I/O In: 240 [P.O.:240] Out: -   Labs: No results found for this basename: WBC:3,HGB:3,PLT:3,LABCREA:3,CREATININE:3 in the last 72 hours Estimated Creatinine Clearance: 98 ml/min (by C-G formula based on Cr of 1.2).  Basename 07/18/12 2148  VANCOTROUGH --  VANCOPEAK --  Drue Dun --  GENTTROUGH --  GENTPEAK --  GENTRANDOM 2.1  TOBRATROUGH --  TOBRAPEAK --  TOBRARND --  AMIKACINPEAK --  AMIKACINTROU --  AMIKACIN --     Microbiology: Recent Results (from the past 720 hour(s))  MRSA PCR SCREENING     Status: Normal   Collection Time   07/18/12  1:49 PM      Component Value Range Status Comment   MRSA by PCR NEGATIVE  NEGATIVE Final     Medical History: History reviewed. No pertinent past medical history.  Medications:  Scheduled:     . ampicillin-sulbactam (UNASYN) IV  3 g Intravenous Q6H  . [COMPLETED] chlorhexidine  60 mL Topical Once  . gentamicin  650 mg Intravenous Q24H  . HYDROmorphone      . HYDROmorphone       Assessment: 37 yr old male s/p I&D of left knee after chainsaw injury. Patient is on Unasyn and gentamicin.   Gentamicin level (~ 10 hours post-dose) of 2.1 mcg/mL is within goal range for Q24H frequency for high-dose extended-interval dosing.   Plan:  1. Continue gentamicin 650mg  IV Q24H.   Emeline Gins 07/19/2012,1:32 AM

## 2012-07-19 NOTE — Progress Notes (Signed)
Orthopedics Progress Note  Subjective: I feel sore this morning  Objective:  Filed Vitals:   07/19/12 0639  BP: 123/64  Pulse: 80  Temp: 99.2 F (37.3 C)  Resp: 18    General: Awake and alert  Musculoskeletal: Left knee dressing intact with knee immob in place, ankle pumps without pain Neurovascularly intact  Lab Results  Component Value Date   WBC 11.0* 06/11/2012   HGB 14.6 06/11/2012   HCT 42.7 06/11/2012   MCV 91.6 06/11/2012   PLT 217 06/11/2012       Component Value Date/Time   NA 139 09/21/2009 0535   K 3.8 09/21/2009 0535   CL 98 09/21/2009 0535   CO2 35* 09/21/2009 0535   GLUCOSE 101* 09/21/2009 0535   BUN 10 09/21/2009 0535   CREATININE 1.21 07/19/2012 0312   CALCIUM 8.4 09/21/2009 0535   GFRNONAA 77* 07/19/2012 0312   GFRAA 89* 07/19/2012 0312    Lab Results  Component Value Date   INR 1.06 09/13/2009    Assessment/Plan: POD #1 s/p Procedure(s): IRRIGATION AND DEBRIDEMENT EXTREMITY Doing well at this point. Plan to continue IV antibiotics until tomorrow morning and then D/C home on oral Augmentin. Dressing change tomorrow before d/c. Plan daily dry dressing changes after that. Elevation and immobilization until follow up in a week in the office with me. Rx on chart D/C summary pended.  Almedia Balls. Ranell Patrick, MD 07/19/2012 7:57 AM

## 2012-07-20 MED ORDER — FLEET ENEMA 7-19 GM/118ML RE ENEM: 1.0000 | ENEMA | Freq: Once | RECTAL | Status: DC

## 2012-07-20 MED ORDER — BISACODYL 10 MG RE SUPP
10.0000 mg | Freq: Once | RECTAL | Status: AC
  Administered 2012-07-20: 10 mg via RECTAL
  Filled 2012-07-20: qty 1

## 2012-07-20 NOTE — Progress Notes (Signed)
Patient ID: Eduardo Potts, male   DOB: Jul 03, 1975, 37 y.o.   MRN: [id number] Subjective: 2 Days Post-Op Procedure(s) (LRB): IRRIGATION AND DEBRIDEMENT EXTREMITY (Left)    Patient reports pain as mild.  Pain improved.  Still has concerns with increased erythema medial thigh  Objective:   VITALS:   Filed Vitals:   07/20/12 0649  BP: 104/49  Pulse: 60  Temp: 97.6 F (36.4 C)  Resp: 18    Neurovascular intact Incision: no drainage  Erythema extending beyond demarcated area on left medial thigh No fluctuance, minor warmth, mild tenderness  Knee wounds healing stable improved since trip to OR   LABS No results found for this basename: HGB:3,HCT:3,WBC:3,PLT:3 in the last 72 hours   Basename 07/19/12 0312  NA --  K --  BUN --  CREATININE 1.21  GLUCOSE --    No results found for this basename: LABPT:2,INR:2 in the last 72 hours   Assessment/Plan: 2 Days Post-Op Procedure(s) (LRB): IRRIGATION AND DEBRIDEMENT EXTREMITY (Left)   Continue ABX therapy due to infection in left lower extremity Plan to stay one more day with IV antibiotics Wishes to stay on IV antibiotics and see if erythema improves before [facility]  Norris to see in am for d/c

## 2012-07-21 ENCOUNTER — Encounter (HOSPITAL_COMMUNITY): Payer: Self-pay | Admitting: Orthopedic Surgery

## 2012-07-21 NOTE — Progress Notes (Signed)
Utilization review completed. Jenny Omdahl, RN, BSN. 

## 2012-07-21 NOTE — Discharge Summary (Signed)
Physician Discharge Summary   Patient ID: [name] MRN: [id number] DOB/AGE: 07/19/1975 37 y.o.  Admit date: 07/18/2012 Discharge date: 07/21/2012  Admission Diagnoses:  Principal Problem:  *Cellulitis of knee, left   Discharge Diagnoses:  Same   Surgeries: Procedure(s): IRRIGATION AND DEBRIDEMENT EXTREMITY on 07/18/2012   Consultants: none  Discharged Condition: Stable  Hospital Course: Eduardo Potts is an 37 y.o. male who was admitted 07/18/2012 with a chief complaint of No chief complaint on file. , and found to have a diagnosis of Cellulitis of knee, left.  They were brought to the operating room on 07/18/2012 and underwent the above named procedures.    The patient had an uncomplicated hospital course and was stable for discharge.  Recent vital signs:  Filed Vitals:   07/21/12 0537  BP: 114/70  Pulse: 67  Temp: 98 F (36.7 C)  Resp: 18    Recent laboratory studies:  Results for orders placed during the hospital encounter of 07/18/12  MRSA PCR SCREENING      Component Value Range   MRSA by PCR NEGATIVE  NEGATIVE  GENTAMICIN LEVEL, RANDOM      Component Value Range   Gentamicin Rm 2.1    CREATININE, SERUM      Component Value Range   Creatinine, Ser 1.21  0.50 - 1.35 mg/dL   GFR calc non Af Amer 77 (*) >90 mL/min   GFR calc Af Amer 89 (*) >90 mL/min    Discharge Medications:     Medication List     As of 07/21/2012 10:10 AM    STOP taking these medications         cephALEXin 500 MG capsule   Commonly known as: KEFLEX      TAKE these medications         ALPRAZolam 0.5 MG tablet   Commonly known as: XANAX   Take 0.5 mg by mouth at bedtime as needed. For sleep or anxiety      amoxicillin-clavulanate 500-125 MG per tablet   Commonly known as: AUGMENTIN   Take 1 tablet (500 mg total) by mouth 3 (three) times daily.      methocarbamol 500 MG tablet   Commonly known as: ROBAXIN   Take 500 mg by mouth every 6 (six) hours as needed. For  muscle spasms      oxyCODONE-acetaminophen 5-325 MG per tablet   Commonly known as: PERCOCET/ROXICET   Take 1-2 tablets by mouth every 4 (four) hours as needed for pain.      sulfamethoxazole-trimethoprim 800-160 MG per tablet   Commonly known as: BACTRIM DS,SEPTRA DS   Take 1 tablet by mouth 2 (two) times daily.        Diagnostic Studies: Dg Knee Complete 4 Views Left  07/14/2012  *RADIOLOGY REPORT*  Clinical Data: Recent injury with laceration  LEFT KNEE - COMPLETE 4+ VIEW  Comparison: None.  Findings: No acute fracture or dislocation is noted.  The large soft tissue laceration is noted in the infrapatellar region.  There may be compromise of the infrapatellar ligament.  No radiopaque foreign body is seen.  No other focal abnormality is noted.  IMPRESSION: Soft tissue injury below the patella as described.   Original Report Authenticated By: Alcide Clever, M.D.     Disposition: 01-Home or Self Care        Follow-up Information    Follow up with NORRIS,STEVEN R, MD. Schedule an appointment as soon as possible for a visit in 1 week. [phone])  Contact information:   [address], [address] [place] [address] [phone]           Signed: Thea Gist 07/21/2012, 10:10 AM

## 2012-07-21 NOTE — Op Note (Signed)
NAME:  DEVON, PRETTY NO.:  [id number]  MEDICAL RECORD NO.:  [id number]  LOCATION:                                 FACILITY:  PHYSICIAN:  Almedia Balls. Ranell Patrick, M.D. DATE OF BIRTH:  01/07/1976  DATE OF PROCEDURE:  07/18/2012 DATE OF DISCHARGE:                              OPERATIVE REPORT   PREOPERATIVE DIAGNOSIS:  Left leg traumatic wound from chainsaw with infection.  POSTOPERATIVE DIAGNOSIS:  Left leg traumatic wound from chainsaw with infection.  PROCEDURE PERFORMED:  I and D of left prepatellar wound, left knee.  ATTENDING SURGEON:  Almedia Balls. Ranell Patrick, M.D.  ASSISTANT:  None.  ANESTHESIA:  LMA general anesthesia was used.  ESTIMATED BLOOD LOSS:  Minimal.  FLUID REPLACEMENT:  1000 mL crystalloid.  INSTRUMENT COUNTS:  Correct.  COMPLICATIONS:  There were no complications.  Perioperative antibiotics were given.  INDICATIONS:  The patient is a 37 year old male, who is status post chainsaw injury to his left knee about 5 or 6 days ago.  The patient was seen as an outpatient at the urgent care.  Sutures placed.  The patient was placed on oral antibiotics.  Since has been placed on another oral antibiotic due to progressive redness and concern for infection.  He was seen by Dr. Bard Herbert clinic today and was felt to be progressing with erythema and concern over expanding cellulitis.  He was referred to the operating room for definitive I and D today and IV antibiotics hospitalization.  Informed consent was obtained.  DESCRIPTION OF PROCEDURE:  After an adequate level of anesthesia achieved, the patient was positioned in supine on the operating table. Nonsterile tourniquet placed on left proximal thigh.  Left leg was sterilely prepped and draped in usual manner.  Sutures from the patient's prior closure had been removed.  We opened up the wound sharply using a knife.  There was some layered suture material that was removed as well.  We freshened  up the edges of the wound.  There were some necrotic tissue right at the wound edges.  I did not detect any gross pus.  We did a pulse irrigation with 3 L of normal saline irrigation.  There was also no violation of the peritenon for the patellar tendon, nor any violation of the joint.  Again, we did a thorough pulse irrigation as well as sharp debridement using scissors and knife back to stable bleeding tissue.  We then did closure with interrupted nylon suture  decent intervals to allow for drainage.  We then placed a nonstick Adaptic dressing and 4x4s, ABD, Kerlix, sterile bandage, and then an Ace wrap was applied.  Knee immobilizer and cryotherapy. The patient transported to the recovery room in stable condition.     Almedia Balls. Ranell Patrick, M.D.     SRN/MEDQ  D:  07/18/2012  T:  07/19/2012  Job:  [id number]

## 2012-07-21 NOTE — Progress Notes (Signed)
   Subjective: 3 Days Post-Op Procedure(s) (LRB): IRRIGATION AND DEBRIDEMENT EXTREMITY (Left)   Patient reports pain as mild. Pt doing well Minimal pain, ready for d/c home  Objective:   VITALS:   Filed Vitals:   07/21/12 0537  BP: 114/70  Pulse: 67  Temp: 98 F (36.7 C)  Resp: 18    Neurovascular intact Left knee wounds healing well No continued cellulitis or lymphadenitis No drainage  LABS No results found for this basename: HGB:3,HCT:3,WBC:3,PLT:3 in the last 72 hours   Basename 07/19/12 0312  NA --  K --  BUN --  CREATININE 1.21  GLUCOSE --     Assessment/Plan: 3 Days Post-Op Procedure(s) (LRB): IRRIGATION AND DEBRIDEMENT EXTREMITY (Left)   Discharge home today F/u in 1 week for wound check  Continue oral antibiotics    Alphonsa Overall, MPAS, PA-C  07/21/2012, 10:08 AM

## 2013-03-09 ENCOUNTER — Emergency Department (HOSPITAL_COMMUNITY)
Admission: EM | Admit: 2013-03-09 | Discharge: 2013-03-09 | Disposition: A | Discharge status: Home / Self Care | Payer: 59 | Source: Home / Self Care | Attending: Family Medicine | Admitting: Family Medicine

## 2013-03-09 ENCOUNTER — Emergency Department (INDEPENDENT_AMBULATORY_CARE_PROVIDER_SITE_OTHER): Payer: 59

## 2013-03-09 ENCOUNTER — Encounter (HOSPITAL_COMMUNITY): Payer: Self-pay | Admitting: Emergency Medicine

## 2013-03-09 DIAGNOSIS — J40 Bronchitis, not specified as acute or chronic: Secondary | ICD-10-CM

## 2013-03-09 MED ORDER — ALBUTEROL SULFATE HFA 108 (90 BASE) MCG/ACT IN AERS: 2.0000 | INHALATION_SPRAY | Freq: Four times a day (QID) | RESPIRATORY_TRACT | Status: DC | PRN

## 2013-03-09 MED ORDER — PREDNISONE 50 MG PO TABS: 50.0000 mg | ORAL_TABLET | Freq: Every day | ORAL | Status: DC

## 2013-03-09 MED ORDER — ALBUTEROL SULFATE (5 MG/ML) 0.5% IN NEBU
INHALATION_SOLUTION | RESPIRATORY_TRACT | Status: AC
  Filled 2013-03-09: qty 1

## 2013-03-09 MED ORDER — AZITHROMYCIN 250 MG PO TABS: 250.0000 mg | ORAL_TABLET | Freq: Every day | ORAL | Status: DC

## 2013-03-09 MED ORDER — ALBUTEROL SULFATE (5 MG/ML) 0.5% IN NEBU
5.0000 mg | INHALATION_SOLUTION | Freq: Once | RESPIRATORY_TRACT | Status: AC
  Administered 2013-03-09: 5 mg via RESPIRATORY_TRACT

## 2013-03-09 MED ORDER — IPRATROPIUM BROMIDE 0.02 % IN SOLN
0.5000 mg | Freq: Once | RESPIRATORY_TRACT | Status: AC
  Administered 2013-03-09: 0.5 mg via RESPIRATORY_TRACT

## 2013-03-09 NOTE — ED Notes (Signed)
Call from wife, no record of rx on file at [facility] in [place]. Asked her to call us if there is a problem when she gets there. Called and spoke w pharmacy, and there is no record of Rx received. Gave telephone orders for R as planned during his visit to [facility] earlier today

## 2013-03-09 NOTE — ED Notes (Signed)
C/o sob which started as a head cold a week ago and is now in his chest.  OTC medications tried but no relief.

## 2013-03-09 NOTE — ED Provider Notes (Signed)
Eduardo Potts is a 37 y.o. male who presents to Urgent Care today for cough congestion shortness of breath nasal discharge and nasal congestion. This is been present for about one week. If not improving. He's tried over-the-counter cold medications which have not helped much. He owns a tree removal business. He denies any fevers chills nausea vomiting or diarrhea. Feels well otherwise.    History reviewed. No pertinent past medical history. History  Substance Use Topics  . Smoking status: Former Smoker    Quit date: 07/19/2007  . Smokeless tobacco: Not on file  . Alcohol Use: No   ROS as above Medications reviewed. No current facility-administered medications for this encounter.   Current Outpatient Prescriptions  Medication Sig Dispense Refill  . albuterol (PROVENTIL HFA;VENTOLIN HFA) 108 (90 BASE) MCG/ACT inhaler Inhale 2 puffs into the lungs every 6 (six) hours as needed for wheezing.  1 Inhaler  2  . ALPRAZolam (XANAX) 0.5 MG tablet Take 0.5 mg by mouth at bedtime as needed. For sleep or anxiety      . azithromycin (ZITHROMAX) 250 MG tablet Take 1 tablet (250 mg total) by mouth daily. Take first 2 tablets together, then 1 every day until finished.  6 tablet  0  . predniSONE (DELTASONE) 50 MG tablet Take 1 tablet (50 mg total) by mouth daily.  5 tablet  0    Exam:  BP 151/100  Pulse 66  Temp(Src) 98.5 F (36.9 C) (Oral)  Resp 18  SpO2 96% Gen: Well NAD HEENT: EOMI,  MMM Lungs: Normal work of breathing. Rhonchorous breath sounds bilaterally. Wheezing present bilaterally.  Heart: RRR no MRG Abd: NABS, NT, ND Exts: Non edematous BL  LE, warm and well perfused.   Patient was given a albuterol and Atrovent nebulizer breathing treatment. He had some mild improvement in symptoms. His lung sounds cleared however he continued to have rhonchorous breath sounds present   No results found for this or any previous visit (from the past 24 hour(s)). Dg Chest 2 View  03/09/2013    CLINICAL DATA:  Shortness of Breath.  EXAM: CHEST  2 VIEW  COMPARISON:  02/17/2005  FINDINGS: Mild peribronchial thickening. Heart and mediastinal contours are within normal limits. No focal opacities or effusions. No acute bony abnormality.  IMPRESSION: Mild bronchitic changes.   Electronically Signed   By: Charlett Nose M.D.   On: 03/09/2013 13:43    Assessment and Plan: 37 y.o. male with bronchitis.  Plan to treat with prednisone azithromycin and albuterol.  Followup as needed Discussed warning signs or symptoms. Please see discharge instructions. Patient expresses understanding.      Rodolph Bong, MD 03/09/13 [phone]

## 2013-06-13 ENCOUNTER — Emergency Department (INDEPENDENT_AMBULATORY_CARE_PROVIDER_SITE_OTHER): Payer: 59

## 2013-06-13 ENCOUNTER — Emergency Department (HOSPITAL_COMMUNITY)
Admission: EM | Admit: 2013-06-13 | Discharge: 2013-06-13 | Disposition: A | Discharge status: Home / Self Care | Payer: 59 | Source: Home / Self Care | Attending: Emergency Medicine | Admitting: Emergency Medicine

## 2013-06-13 ENCOUNTER — Emergency Department (HOSPITAL_COMMUNITY)
Admission: EM | Admit: 2013-06-13 | Discharge: 2013-06-14 | Disposition: A | Discharge status: Home / Self Care | Payer: 59 | Attending: Emergency Medicine | Admitting: Emergency Medicine

## 2013-06-13 ENCOUNTER — Encounter (HOSPITAL_COMMUNITY): Payer: Self-pay | Admitting: Emergency Medicine

## 2013-06-13 DIAGNOSIS — R509 Fever, unspecified: Secondary | ICD-10-CM | POA: Insufficient documentation

## 2013-06-13 DIAGNOSIS — R5381 Other malaise: Secondary | ICD-10-CM | POA: Insufficient documentation

## 2013-06-13 DIAGNOSIS — J40 Bronchitis, not specified as acute or chronic: Secondary | ICD-10-CM

## 2013-06-13 DIAGNOSIS — R1115 Cyclical vomiting syndrome unrelated to migraine: Secondary | ICD-10-CM

## 2013-06-13 DIAGNOSIS — R05 Cough: Secondary | ICD-10-CM | POA: Insufficient documentation

## 2013-06-13 DIAGNOSIS — R112 Nausea with vomiting, unspecified: Secondary | ICD-10-CM

## 2013-06-13 DIAGNOSIS — Z885 Allergy status to narcotic agent status: Secondary | ICD-10-CM | POA: Insufficient documentation

## 2013-06-13 DIAGNOSIS — R059 Cough, unspecified: Secondary | ICD-10-CM | POA: Insufficient documentation

## 2013-06-13 DIAGNOSIS — J209 Acute bronchitis, unspecified: Secondary | ICD-10-CM | POA: Insufficient documentation

## 2013-06-13 DIAGNOSIS — E860 Dehydration: Secondary | ICD-10-CM

## 2013-06-13 DIAGNOSIS — Z87891 Personal history of nicotine dependence: Secondary | ICD-10-CM | POA: Insufficient documentation

## 2013-06-13 DIAGNOSIS — A419 Sepsis, unspecified organism: Secondary | ICD-10-CM | POA: Insufficient documentation

## 2013-06-13 DIAGNOSIS — R197 Diarrhea, unspecified: Secondary | ICD-10-CM | POA: Insufficient documentation

## 2013-06-13 DIAGNOSIS — R6510 Systemic inflammatory response syndrome (SIRS) of non-infectious origin without acute organ dysfunction: Secondary | ICD-10-CM

## 2013-06-13 DIAGNOSIS — R51 Headache: Secondary | ICD-10-CM | POA: Insufficient documentation

## 2013-06-13 DIAGNOSIS — D72829 Elevated white blood cell count, unspecified: Secondary | ICD-10-CM | POA: Insufficient documentation

## 2013-06-13 LAB — COMPREHENSIVE METABOLIC PANEL
Albumin: 4.1 g/dL (ref 3.5–5.2)
BUN: 16 mg/dL (ref 6–23)
Creatinine, Ser: 1.07 mg/dL (ref 0.50–1.35)
Total Protein: 7.7 g/dL (ref 6.0–8.3)

## 2013-06-13 LAB — CBC WITH DIFFERENTIAL/PLATELET
Basophils Relative: 0 % (ref 0–1)
Eosinophils Absolute: 0 10*3/uL (ref 0.0–0.7)
HCT: 48.5 % (ref 39.0–52.0)
Hemoglobin: 16.1 g/dL (ref 13.0–17.0)
MCH: 30.7 pg (ref 26.0–34.0)
MCHC: 33.2 g/dL (ref 30.0–36.0)
Monocytes Absolute: 0.8 10*3/uL (ref 0.1–1.0)
Monocytes Relative: 5 % (ref 3–12)
Neutrophils Relative %: 93 % — ABNORMAL HIGH (ref 43–77)

## 2013-06-13 MED ORDER — SODIUM CHLORIDE 0.9 % IV BOLUS (SEPSIS)
1000.0000 mL | Freq: Once | INTRAVENOUS | Status: AC
  Administered 2013-06-13: 1000 mL via INTRAVENOUS

## 2013-06-13 MED ORDER — ONDANSETRON HCL 4 MG/2ML IJ SOLN
4.0000 mg | Freq: Once | INTRAMUSCULAR | Status: AC
  Administered 2013-06-13: 4 mg via INTRAVENOUS
  Filled 2013-06-13: qty 2

## 2013-06-13 MED ORDER — HYDROCOD POLST-CHLORPHEN POLST 10-8 MG/5ML PO LQCR
5.0000 mL | Freq: Once | ORAL | Status: AC
  Administered 2013-06-13: 5 mL via ORAL
  Filled 2013-06-13: qty 5

## 2013-06-13 MED ORDER — ONDANSETRON 4 MG PO TBDP
8.0000 mg | ORAL_TABLET | Freq: Once | ORAL | Status: AC
  Administered 2013-06-13: 8 mg via ORAL

## 2013-06-13 MED ORDER — KETOROLAC TROMETHAMINE 30 MG/ML IJ SOLN
30.0000 mg | Freq: Once | INTRAMUSCULAR | Status: AC
  Administered 2013-06-13: 30 mg via INTRAVENOUS
  Filled 2013-06-13: qty 1

## 2013-06-13 MED ORDER — ONDANSETRON 4 MG PO TBDP
ORAL_TABLET | ORAL | Status: AC
  Filled 2013-06-13: qty 2

## 2013-06-13 NOTE — ED Provider Notes (Signed)
CSN: [id number]     Arrival date & time 06/13/13  1906 History   First MD Initiated Contact with Patient 06/13/13 1919     Chief Complaint  Patient presents with  . URI   (Consider location/radiation/quality/duration/timing/severity/associated sxs/prior Treatment) Patient is a 37 y.o. male presenting with vomiting and diarrhea. The history is provided by the patient and the spouse.  Emesis Associated symptoms: abdominal pain, chills, cough, diarrhea, headaches and myalgias   Diarrhea Quality:  Watery Severity:  Severe Onset quality:  Gradual Duration:  1 week Progression:  Unchanged Relieved by:  Nothing Associated symptoms: abdominal pain, chills, cough, fever, headaches, myalgias and vomiting   Abdominal pain:    Location:  Generalized   Quality:  Cramping   Severity:  Moderate   Onset quality:  Gradual   Duration:  1 week   Timing:  Constant   Progression:  Worsening Vomiting:    Quality:  Stomach contents   Number of occurrences:  Countless   Severity:  Severe (unable to keep clear liquids down x past 36 hours)   Progression:  Worsening Risk factors: no recent antibiotic use, no sick contacts, no suspicious food intake and no travel to endemic areas     History reviewed. No pertinent past medical history. Past Surgical History  Procedure Laterality Date  . Arm    . Hip surgery      left  . Thumb surgery    . Knee surgery      right knee  . Fasciotomy  06/11/2012    Procedure: FASCIOTOMY;  Surgeon: Dominica Severin, MD;  Location: [facility] OR;  Service: Orthopedics;  Laterality: Right;  . I&d extremity  07/18/2012    Procedure: IRRIGATION AND DEBRIDEMENT EXTREMITY;  Surgeon: Verlee Rossetti, MD;  Location: [facility] OR;  Service: Orthopedics;  Laterality: Left;  I & D Left knee    No family history on file. History  Substance Use Topics  . Smoking status: Former Smoker    Quit date: 07/19/2007  . Smokeless tobacco: Not on file  . Alcohol Use: No    Review of Systems   Constitutional: Positive for fever, chills and fatigue.  HENT: Positive for congestion and rhinorrhea.   Eyes: Negative.   Respiratory: Negative.   Cardiovascular: Negative.   Gastrointestinal: Positive for nausea, vomiting, abdominal pain and diarrhea. Negative for blood in stool.  Endocrine: Negative for polydipsia, polyphagia and polyuria.  Genitourinary: Positive for decreased urine volume.  Musculoskeletal: Positive for myalgias.  Skin: Negative.   Allergic/Immunologic: Negative for immunocompromised state.  Neurological: Positive for dizziness, weakness, light-headedness and headaches.  Psychiatric/Behavioral: Negative.     Allergies  Morphine and related  Home Medications   Current Outpatient Rx  Name  Route  Sig  Dispense  Refill  . chlorpheniramine-HYDROcodone (TUSSIONEX PENNKINETIC ER) 10-8 MG/5ML LQCR   Oral   Take 5 mLs by mouth every 12 (twelve) hours as needed for cough.   100 mL   0   . Dextromethorphan-Guaifenesin (MUCINEX DM PO)   Oral   Take 1 tablet by mouth 2 (two) times daily.         Marland Kitchen ibuprofen (ADVIL,MOTRIN) 200 MG tablet   Oral   Take 200 mg by mouth every 6 (six) hours as needed for fever or headache.         . levofloxacin (LEVAQUIN) 750 MG tablet   Oral   Take 1 tablet (750 mg total) by mouth daily. X 7 days   5 tablet  0   . ondansetron (ZOFRAN ODT) 4 MG disintegrating tablet      4mg  ODT q4 hours prn nausea/vomit   10 tablet   0    BP 145/87  Pulse 128  Temp(Src) 99.9 F (37.7 C) (Oral)  Resp 23  SpO2 95% Physical Exam  Nursing note and vitals reviewed. Constitutional: He is oriented to person, place, and time. He appears well-developed and well-nourished. He appears distressed.  +appears ill, moaning and lying in fetal position on exam table  HENT:  Head: Normocephalic and atraumatic.  Mouth/Throat: Mucous membranes are dry.  Eyes: Conjunctivae are normal. No scleral icterus.  Neck: Normal range of motion and full  passive range of motion without pain. Neck supple.  Cardiovascular: Regular rhythm and normal heart sounds.   +tachycardia  Pulmonary/Chest: Effort normal and breath sounds normal.  Abdominal: Bowel sounds are decreased. There is generalized tenderness. There is no CVA tenderness.  Abdomen firm to palpation with moderate tenderness  Musculoskeletal: Normal range of motion.  Lymphadenopathy:    He has no cervical adenopathy.  Neurological: He is alert and oriented to person, place, and time.  Skin: Skin is warm. No rash noted. He is diaphoretic.    ED Course  Procedures (including critical care time) Labs Review Labs Reviewed - No data to display Imaging Review Dg Chest 2 View  06/13/2013   CLINICAL DATA:  Cough  EXAM: CHEST  2 VIEW  COMPARISON:  03/09/2013  FINDINGS: Lungs are clear. No pleural effusion or pneumothorax.  The heart is normal in size.  Stable fullness of the bilateral pulmonary hila, similar to 2006, likely vascular.  Degenerative changes of the visualized thoracolumbar spine.  IMPRESSION: No evidence of acute cardiopulmonary disease.   Electronically Signed   By: Charline Bills M.D.   On: 06/13/2013 20:25    EKG Interpretation    Date/Time:    Ventricular Rate:    PR Interval:    QRS Duration:   QT Interval:    QTC Calculation:   R Axis:     Text Interpretation:              MDM  Patient with dehydration from prolonged GI illness now with moderate to severe abdominal pain, tachycardia and inability to tolerate clear liquids. Would benefit from IV rehydration and possible abdominal imaging. Will transfer to [facility] via shuttle for further management.    Jess Barters [place], [place] 06/14/13 1053

## 2013-06-13 NOTE — ED Provider Notes (Signed)
CSN: [id number]     Arrival date & time 06/13/13  2024 History   First MD Initiated Contact with Patient 06/13/13 2306     Chief Complaint  Patient presents with  . Emesis  . Diarrhea   (Consider location/radiation/quality/duration/timing/severity/associated sxs/prior Treatment) HPI Patient is a 43M who presents with approximately 7 days of fever with Tm 102.2F this morning, productive cough, malaise, pounding headache, nausea and vomiting. He estimates that he has had 10 episodes of NBNB emesis so far today. He notes several episodes of non-bloody diarrhea as well.   He denies chest pain and abdominal pain. He has been unable to tolerate po intake. Notes DOE. Patient is a non-smoker. He was referred from [facility] for further evaluation after being seen there at approximately 1900.   Patient denies any recent international travel. He did not receive an influenza vax this year. He notes that his dtr has had "the stomach flu" but states that her sx are much less severe than his.   History reviewed. No pertinent past medical history. Past Surgical History  Procedure Laterality Date  . Arm    . Hip surgery      left  . Thumb surgery    . Knee surgery      right knee  . Fasciotomy  06/11/2012    Procedure: FASCIOTOMY;  Surgeon: Dominica Severin, MD;  Location: [facility] OR;  Service: Orthopedics;  Laterality: Right;  . I&d extremity  07/18/2012    Procedure: IRRIGATION AND DEBRIDEMENT EXTREMITY;  Surgeon: Verlee Rossetti, MD;  Location: [facility] OR;  Service: Orthopedics;  Laterality: Left;  I & D Left knee    No family history on file. History  Substance Use Topics  . Smoking status: Former Smoker    Quit date: 07/19/2007  . Smokeless tobacco: Not on file  . Alcohol Use: No    Review of Systems Ten point review of symptoms performed and is negative with the exception of symptoms noted above.   Allergies  Morphine and related  Home Medications   Current Outpatient Rx  Name  Route   Sig  Dispense  Refill  . Dextromethorphan-Guaifenesin (MUCINEX DM PO)   Oral   Take 1 tablet by mouth 2 (two) times daily.         Marland Kitchen ibuprofen (ADVIL,MOTRIN) 200 MG tablet   Oral   Take 200 mg by mouth every 6 (six) hours as needed for fever or headache.          BP 135/84  Pulse 97  Temp(Src) 99.2 F (37.3 C) (Oral)  Resp 18  Ht 6\' 1"  (1.854 m)  Wt 203 lb (92.08 kg)  BMI 26.79 kg/m2  SpO2 96% Physical Exam Gen: well developed and well nourished appearing, mildly ill appearing Head: NCAT Eyes: PERL, EOMI Nose: no epistaixis or rhinorrhea Mouth/throat: mucosa is mildly dehydrated appearing Neck: supple, no stridor, no adenopathy Lungs: CTA B, no wheezing, rhonchi or rales, RR 24/min,  No accessory muscle use or retractions Abd: soft, notender, nondistended Back: no ttp, no cva ttp Skin: no rashese, wnl Neuro: CN ii-xii grossly intact, no focal deficits Psyche; normal affect,  calm and cooperative.  ED Course  Procedures (including critical care time) Labs Review  Results for orders placed during the hospital encounter of 06/13/13 (from the past 24 hour(s))  CBC WITH DIFFERENTIAL     Status: Abnormal   Collection Time    06/13/13  8:40 PM      Result Value Range  WBC 15.8 (*) 4.0 - 10.5 K/uL   RBC 5.25  4.22 - 5.81 MIL/uL   Hemoglobin 16.1  13.0 - 17.0 g/dL   HCT 29.5  62.1 - 30.8 %   MCV 92.4  78.0 - 100.0 fL   MCH 30.7  26.0 - 34.0 pg   MCHC 33.2  30.0 - 36.0 g/dL   RDW 65.7  84.6 - 96.2 %   Platelets 249  150 - 400 K/uL   Neutrophils Relative % 93 (*) 43 - 77 %   Neutro Abs 14.6 (*) 1.7 - 7.7 K/uL   Lymphocytes Relative 2 (*) 12 - 46 %   Lymphs Abs 0.3 (*) 0.7 - 4.0 K/uL   Monocytes Relative 5  3 - 12 %   Monocytes Absolute 0.8  0.1 - 1.0 K/uL   Eosinophils Relative 0  0 - 5 %   Eosinophils Absolute 0.0  0.0 - 0.7 K/uL   Basophils Relative 0  0 - 1 %   Basophils Absolute 0.0  0.0 - 0.1 K/uL  COMPREHENSIVE METABOLIC PANEL     Status: Abnormal    Collection Time    06/13/13  8:40 PM      Result Value Range   Sodium 138  135 - 145 mEq/L   Potassium 3.9  3.5 - 5.1 mEq/L   Chloride 100  96 - 112 mEq/L   CO2 25  19 - 32 mEq/L   Glucose, Bld 109 (*) 70 - 99 mg/dL   BUN 16  6 - 23 mg/dL   Creatinine, Ser 9.52  0.50 - 1.35 mg/dL   Calcium 9.0  8.4 - 84.1 mg/dL   Total Protein 7.7  6.0 - 8.3 g/dL   Albumin 4.1  3.5 - 5.2 g/dL   AST 20  0 - 37 U/L   ALT 17  0 - 53 U/L   Alkaline Phosphatase 75  39 - 117 U/L   Total Bilirubin 0.7  0.3 - 1.2 mg/dL   GFR calc non Af Amer 88 (*) >90 mL/min   GFR calc Af Amer >90  >90 mL/min   Imaging Review Dg Chest 2 View  06/13/2013   CLINICAL DATA:  Cough  EXAM: CHEST  2 VIEW  COMPARISON:  03/09/2013  FINDINGS: Lungs are clear. No pleural effusion or pneumothorax.  The heart is normal in size.  Stable fullness of the bilateral pulmonary hila, similar to 2006, likely vascular.  Degenerative changes of the visualized thoracolumbar spine.  IMPRESSION: No evidence of acute cardiopulmonary disease.   Electronically Signed   By: Charline Bills M.D.   On: 06/13/2013 20:25      MDM  Generally healthy man in his 30s with several days of ILI.  He is dehydrated appearing by history and exam. We are resuscitating with crystalloid boluses and treating headache with Toradol. Influenza PCR to lab, although, the patient is outside of the window for Tamiflu treatment. No infiltrate seen on CXR which is unchanged from previous. Of note, the patient completed a Zpack 2 weeks ago for bronchitis.  Labs notable for WBC of 15,8000 with 93% segs. No bands. CMP is unremarkable. We will re-evaluate for disposition after above intervention.   0115:  Patient is feeling much better after above tx. His pulse has normalized and he is tolerating po intake. He is stable for discharge with plan for continued symptomatic management. In light of sx, fever, leukocytosis with left shift, I believe a course of Levaquin is in order for  suspected early CAP.   Brandt Loosen, MD 06/14/13 [phone]

## 2013-06-13 NOTE — ED Notes (Signed)
Pt. transferred from [facility] this evening due to persistent nausea , vomitting and diarrhea with dizziness and headache for 1 week , chest x-ray done at urgent care / received Zofran for nausea.

## 2013-06-13 NOTE — ED Notes (Signed)
Pt c/o cold sxs onset 1 week w/sxs that include: fevers, vomiting, coughing, dizziness, fatigue, diarrhea Has been taking advil and mucinex OTC w/no relief Denies: SOB, wheezing Alert w/no signs of acute distress.

## 2013-06-14 MED ORDER — LEVOFLOXACIN 750 MG PO TABS: 750.0000 mg | ORAL_TABLET | Freq: Every day | ORAL | Status: AC

## 2013-06-14 MED ORDER — ONDANSETRON 4 MG PO TBDP: ORAL_TABLET | ORAL | Status: AC

## 2013-06-14 MED ORDER — HYDROCOD POLST-CHLORPHEN POLST 10-8 MG/5ML PO LQCR: 5.0000 mL | Freq: Two times a day (BID) | ORAL | Status: DC | PRN

## 2013-06-14 NOTE — ED Notes (Signed)
Lab notified to send Influenza swab to Pod A

## 2013-06-14 NOTE — ED Notes (Signed)
Md at bedside

## 2013-06-15 NOTE — ED Provider Notes (Signed)
Medical screening examination/treatment/procedure(s) were performed by non-physician practitioner and as supervising physician I was immediately available for consultation/collaboration.  Leslee Home, M.D.  Reuben Likes, MD 06/15/13 [phone]

## 2013-10-11 ENCOUNTER — Encounter (HOSPITAL_BASED_OUTPATIENT_CLINIC_OR_DEPARTMENT_OTHER): Payer: Self-pay | Admitting: Emergency Medicine

## 2013-10-11 ENCOUNTER — Emergency Department (HOSPITAL_BASED_OUTPATIENT_CLINIC_OR_DEPARTMENT_OTHER): Payer: 59

## 2013-10-11 ENCOUNTER — Emergency Department (HOSPITAL_BASED_OUTPATIENT_CLINIC_OR_DEPARTMENT_OTHER)
Admission: EM | Admit: 2013-10-11 | Discharge: 2013-10-11 | Discharge status: Against Medical Advice | Payer: 59 | Attending: Emergency Medicine | Admitting: Emergency Medicine

## 2013-10-11 DIAGNOSIS — Y929 Unspecified place or not applicable: Secondary | ICD-10-CM | POA: Insufficient documentation

## 2013-10-11 DIAGNOSIS — Z79899 Other long term (current) drug therapy: Secondary | ICD-10-CM | POA: Insufficient documentation

## 2013-10-11 DIAGNOSIS — S01512A Laceration without foreign body of oral cavity, initial encounter: Secondary | ICD-10-CM

## 2013-10-11 DIAGNOSIS — Z792 Long term (current) use of antibiotics: Secondary | ICD-10-CM | POA: Insufficient documentation

## 2013-10-11 DIAGNOSIS — IMO0002 Reserved for concepts with insufficient information to code with codable children: Secondary | ICD-10-CM | POA: Insufficient documentation

## 2013-10-11 DIAGNOSIS — S01502A Unspecified open wound of oral cavity, initial encounter: Secondary | ICD-10-CM | POA: Insufficient documentation

## 2013-10-11 DIAGNOSIS — Y939 Activity, unspecified: Secondary | ICD-10-CM | POA: Insufficient documentation

## 2013-10-11 DIAGNOSIS — Z87891 Personal history of nicotine dependence: Secondary | ICD-10-CM | POA: Insufficient documentation

## 2013-10-11 NOTE — ED Notes (Signed)
Patient did not want head CT, MD aware
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
## 2014-12-20 ENCOUNTER — Ambulatory Visit (INDEPENDENT_AMBULATORY_CARE_PROVIDER_SITE_OTHER): Payer: Self-pay | Admitting: Emergency Medicine

## 2014-12-20 VITALS — BP 130/76 | HR 77 | Temp 97.9°F | Resp 17 | Ht 72.0 in | Wt 228.2 lb

## 2014-12-20 DIAGNOSIS — Z0000 Encounter for general adult medical examination without abnormal findings: Secondary | ICD-10-CM

## 2014-12-20 DIAGNOSIS — Z021 Encounter for pre-employment examination: Secondary | ICD-10-CM

## 2014-12-20 NOTE — Progress Notes (Addendum)
   Subjective:  This chart was scribed for Eduardo LitesSteve Daub, MD by Andrew Auaven Small, ED Scribe. This patient was seen in room 2 and the patient's care was started at 12:31 PM.  Patient ID: Eduardo Potts, male    DOB: 10/10/1975, 39 y.o.   MRN: [id number]  HPI Chief Complaint  Patient presents with  . Annual Exam    DOT    HPI Comments: Eduardo Potts is a 39 y.o. 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 Pt has had multiple surgeries due to several injuries obtained while working in [employer] business. Otherwise pt is healthy.   History reviewed. No pertinent past medical history. Past Surgical History  Procedure Laterality Date  . Arm    . Hip surgery      left  . Thumb surgery    . Knee surgery      right knee  . Fasciotomy  06/11/2012    Procedure: FASCIOTOMY;  Surgeon: Dominica SeverinWilliam Gramig, MD;  [facility];  Service: Orthopedics;  Laterality: Right;  . I&d extremity  07/18/2012    Procedure: IRRIGATION AND DEBRIDEMENT EXTREMITY;  Surgeon: Verlee RossettiSteven R Norris, MD;  [facility];  Service: Orthopedics;  Laterality: Left;  I & D Left knee    Prior to Admission medications   Medication Sig Start Date End Date Taking? Authorizing Provider  Dextromethorphan-Guaifenesin (MUCINEX DM PO) Take 1 tablet by mouth 2 (two) times daily.    Historical Provider, MD  levofloxacin (LEVAQUIN) 750 MG tablet Take 1 tablet (750 mg total) by mouth daily. X 7 days Patient not taking: Reported on 12/20/2014 06/14/13   Brandt LoosenJulie Manly, MD  ondansetron [facility](ZOFRAN ODT) 4 MG disintegrating tablet 4mg  ODT q4 hours prn nausea/vomit Patient not taking: Reported on 12/20/2014 06/14/13   Brandt LoosenJulie Manly, MD   Review of Systems  All other systems reviewed and are negative.   Objective:   Physical Exam CONSTITUTIONAL: Well developed/well nourished HEAD: Normocephalic/atraumatic EYES: EOMI/PERRL ENMT: Mucous membranes moist NECK: supple no meningeal signs SPINE/BACK:entire spine  nontender CV: S1/S2 noted, no murmurs/rubs/gallops noted LUNGS: Lungs are clear to auscultation bilaterally, no apparent distress ABDOMEN: soft, nontender, no rebound or guarding, bowel sounds noted throughout abdomen GU:no cva tenderness NEURO: Pt is awake/alert/appropriate, moves all extremitiesx4.  No facial droop.   EXTREMITIES: pulses normal/equal, full . Healed scars over both forearms with normal functions of both hands.  SKIN: warm, color normal PSYCH: no abnormalities of mood noted, alert and oriented to situation   Assessment & Plan:  Patient qualifies for 2 year card. He has had significant injuries to both forearms but has had good results with surgery and normal function of both hands..I personally performed the services described in this documentation, which was scribed in my presence. The recorded information has been reviewed and is accurate.  Eduardo LitesSteve Daub, MD

## 2015-04-14 IMAGING — CR DG CHEST 2V
2 series · 2 of 2 positions shown · non-contrast
Comparison: 03/09/2013

CLINICAL DATA: Cough

EXAM:
CHEST  2 VIEW

[view not recorded (1 of 2)]
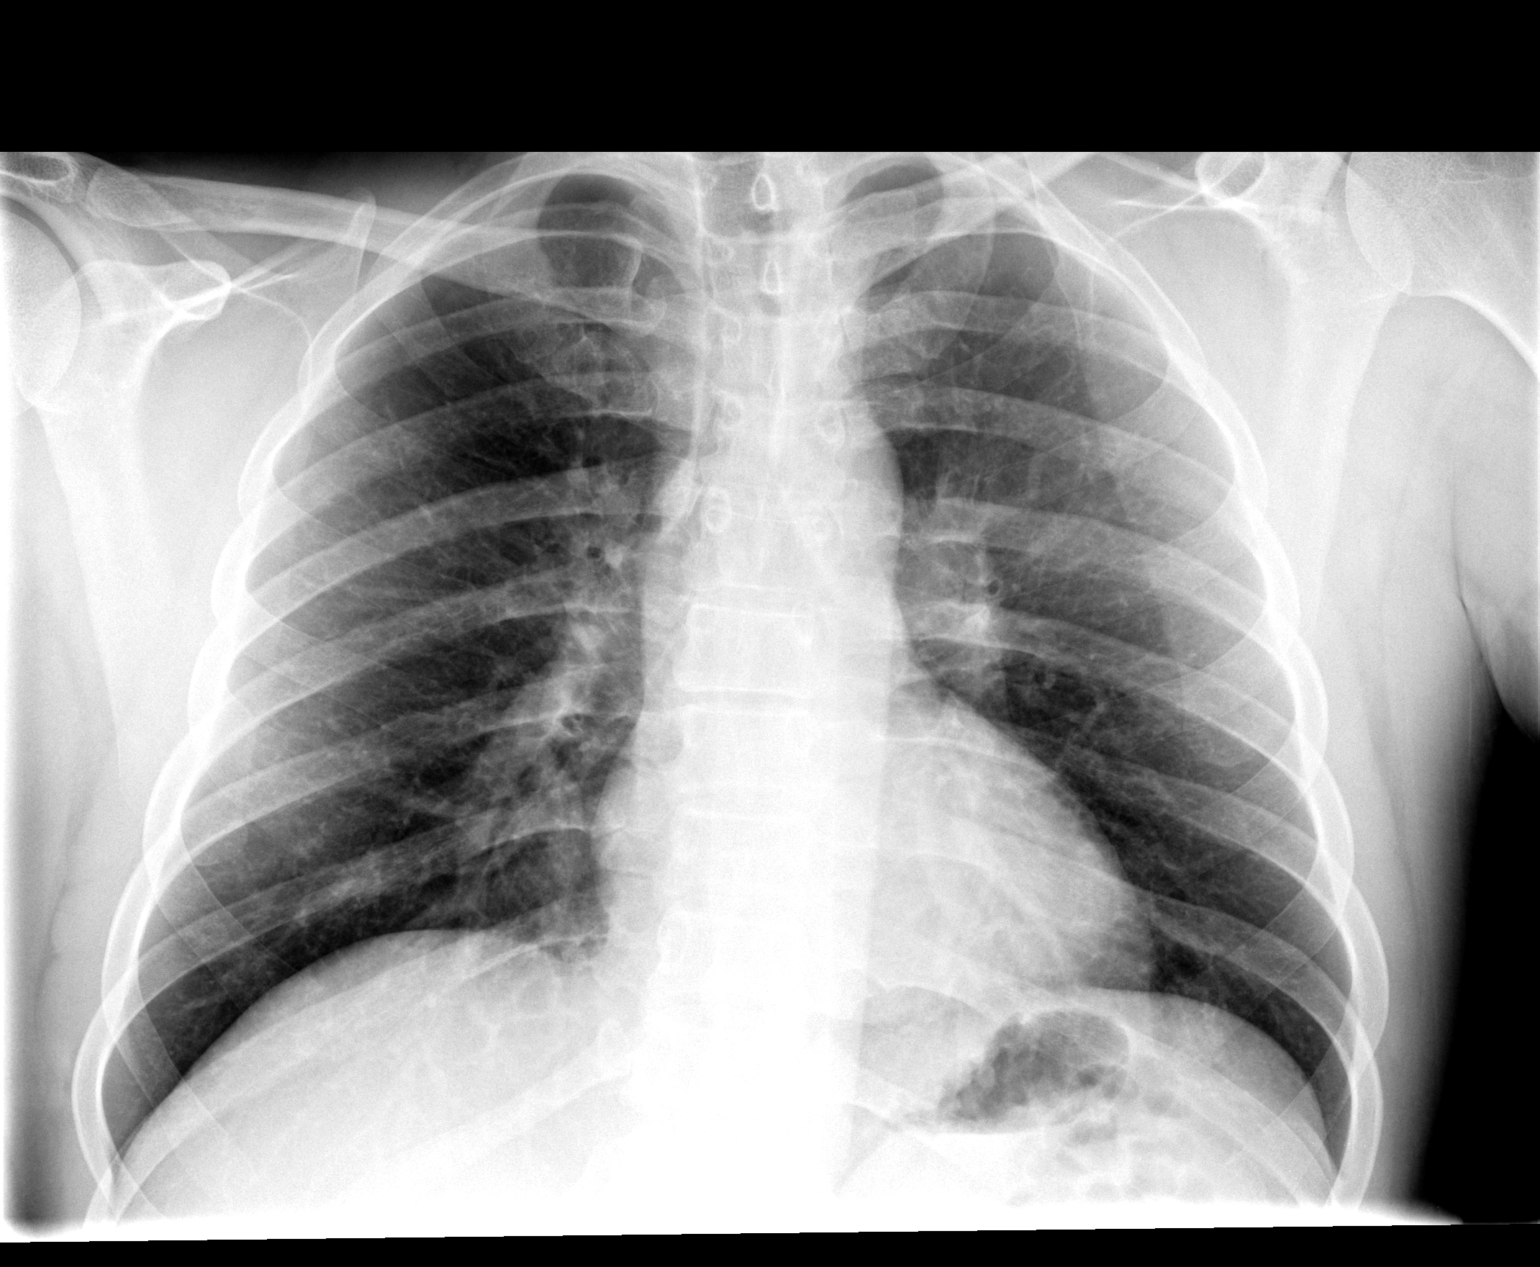

[view not recorded (2 of 2)]
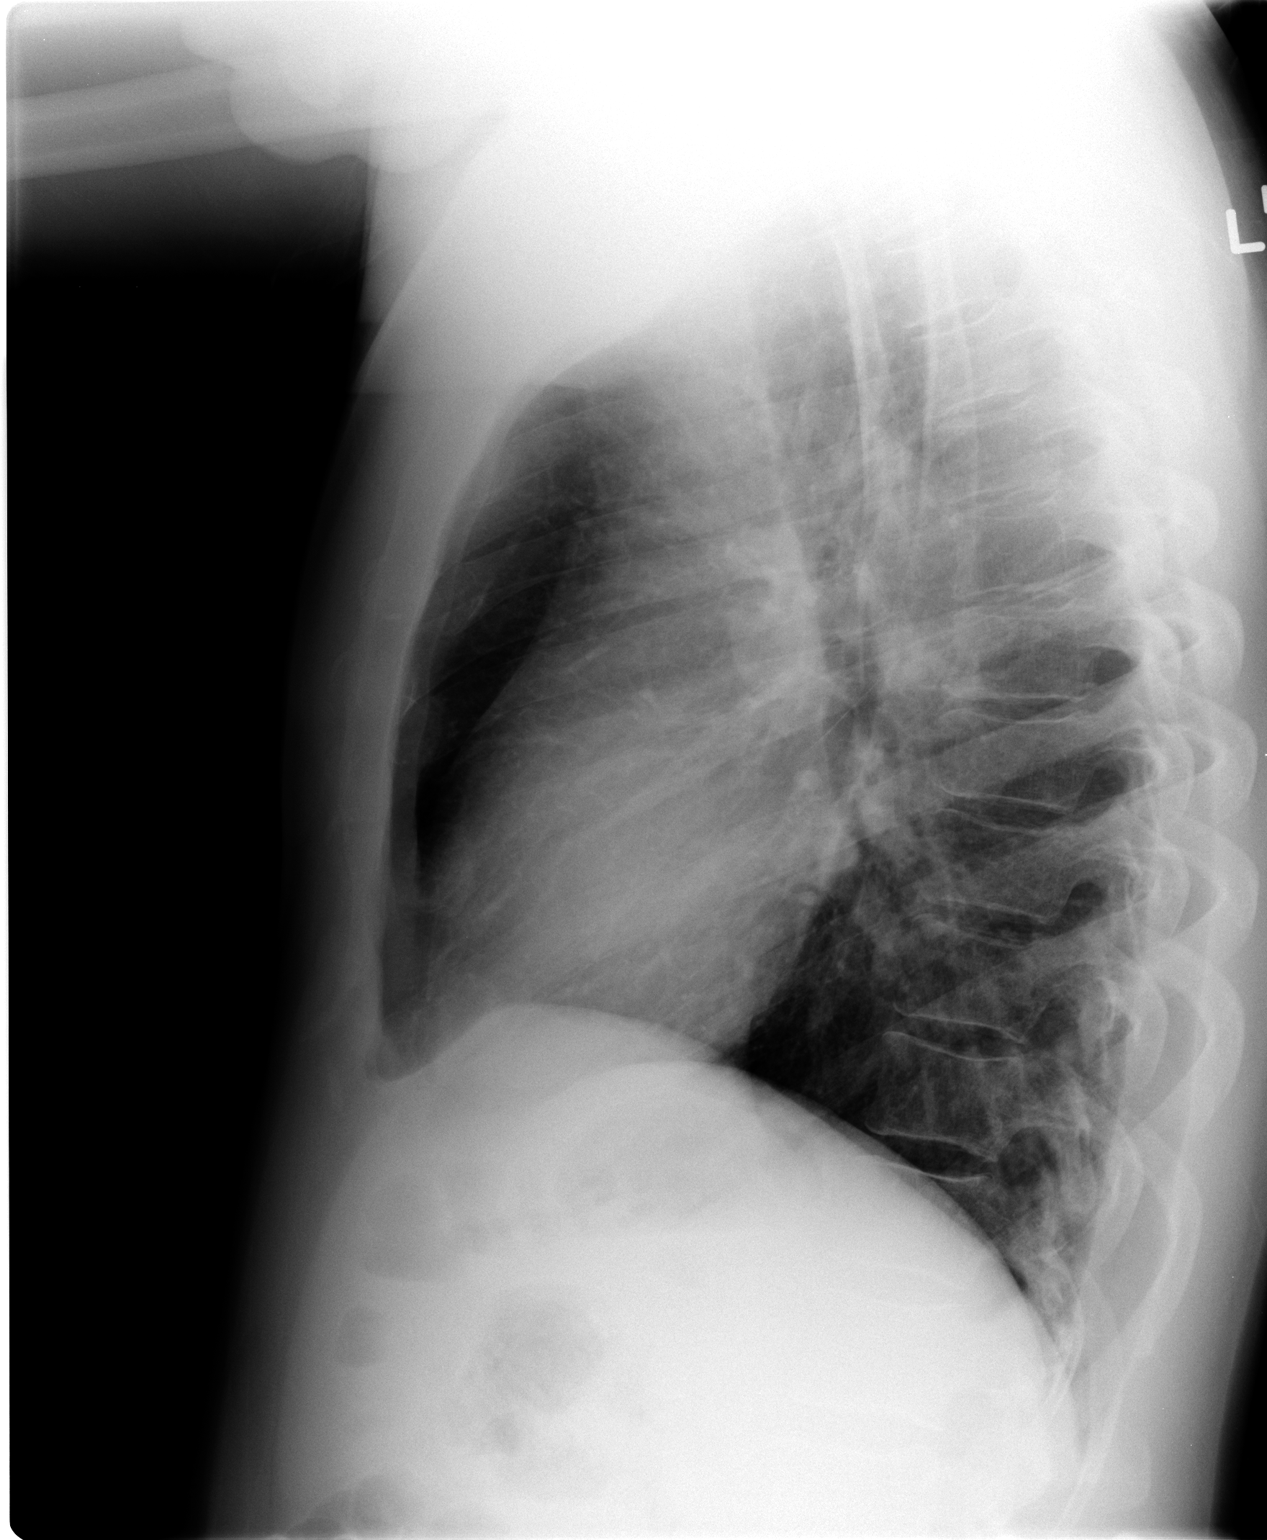

[2 of 2 positions shown; findings below may reference images not displayed]

FINDINGS: Lungs are clear. No pleural effusion or pneumothorax.

The heart is normal in size.

Stable fullness of the bilateral pulmonary hila, similar to 2441,
likely vascular.

Degenerative changes of the visualized thoracolumbar spine.
IMPRESSION: No evidence of acute cardiopulmonary disease.

## 2021-08-09 DIAGNOSIS — Z13220 Encounter for screening for lipoid disorders: Secondary | ICD-10-CM | POA: Diagnosis not present

## 2021-08-09 DIAGNOSIS — Z0000 Encounter for general adult medical examination without abnormal findings: Secondary | ICD-10-CM | POA: Diagnosis not present

## 2021-10-26 DIAGNOSIS — Z03818 Encounter for observation for suspected exposure to other biological agents ruled out: Secondary | ICD-10-CM | POA: Diagnosis not present

## 2021-10-26 DIAGNOSIS — Z20822 Contact with and (suspected) exposure to covid-19: Secondary | ICD-10-CM | POA: Diagnosis not present

## 2021-10-26 DIAGNOSIS — J029 Acute pharyngitis, unspecified: Secondary | ICD-10-CM | POA: Diagnosis not present

## 2021-10-26 DIAGNOSIS — J018 Other acute sinusitis: Secondary | ICD-10-CM | POA: Diagnosis not present

## 2023-04-03 DIAGNOSIS — Z131 Encounter for screening for diabetes mellitus: Secondary | ICD-10-CM | POA: Diagnosis not present

## 2023-04-03 DIAGNOSIS — Z125 Encounter for screening for malignant neoplasm of prostate: Secondary | ICD-10-CM | POA: Diagnosis not present

## 2023-04-03 DIAGNOSIS — E66811 Obesity, class 1: Secondary | ICD-10-CM | POA: Diagnosis not present

## 2023-04-03 DIAGNOSIS — Z0000 Encounter for general adult medical examination without abnormal findings: Secondary | ICD-10-CM | POA: Diagnosis not present

## 2023-04-03 DIAGNOSIS — Z1211 Encounter for screening for malignant neoplasm of colon: Secondary | ICD-10-CM | POA: Diagnosis not present

## 2023-04-03 DIAGNOSIS — Z23 Encounter for immunization: Secondary | ICD-10-CM | POA: Diagnosis not present

## 2023-05-09 DIAGNOSIS — K5730 Diverticulosis of large intestine without perforation or abscess without bleeding: Secondary | ICD-10-CM | POA: Diagnosis not present

## 2023-05-09 DIAGNOSIS — Z1211 Encounter for screening for malignant neoplasm of colon: Secondary | ICD-10-CM | POA: Diagnosis not present

## 2023-05-09 DIAGNOSIS — K648 Other hemorrhoids: Secondary | ICD-10-CM | POA: Diagnosis not present

## 2024-05-25 DIAGNOSIS — R0681 Apnea, not elsewhere classified: Secondary | ICD-10-CM | POA: Diagnosis not present

## 2024-05-25 DIAGNOSIS — E6609 Other obesity due to excess calories: Secondary | ICD-10-CM | POA: Diagnosis not present

## 2024-05-31 ENCOUNTER — Encounter (HOSPITAL_BASED_OUTPATIENT_CLINIC_OR_DEPARTMENT_OTHER): Payer: Self-pay

## 2024-05-31 ENCOUNTER — Other Ambulatory Visit: Payer: Self-pay

## 2024-05-31 ENCOUNTER — Emergency Department (HOSPITAL_BASED_OUTPATIENT_CLINIC_OR_DEPARTMENT_OTHER): Admission: EM | Admit: 2024-05-31 | Discharge: 2024-05-31 | Disposition: A | Discharge status: Home / Self Care

## 2024-05-31 ENCOUNTER — Emergency Department (HOSPITAL_BASED_OUTPATIENT_CLINIC_OR_DEPARTMENT_OTHER)

## 2024-05-31 DIAGNOSIS — S8991XA Unspecified injury of right lower leg, initial encounter: Secondary | ICD-10-CM | POA: Diagnosis present

## 2024-05-31 DIAGNOSIS — Y92410 Unspecified street and highway as the place of occurrence of the external cause: Secondary | ICD-10-CM | POA: Insufficient documentation

## 2024-05-31 DIAGNOSIS — S8391XA Sprain of unspecified site of right knee, initial encounter: Secondary | ICD-10-CM | POA: Insufficient documentation

## 2024-05-31 DIAGNOSIS — G5711 Meralgia paresthetica, right lower limb: Secondary | ICD-10-CM | POA: Diagnosis not present

## 2024-05-31 DIAGNOSIS — M25551 Pain in right hip: Secondary | ICD-10-CM | POA: Diagnosis not present

## 2024-05-31 DIAGNOSIS — S838X1A Sprain of other specified parts of right knee, initial encounter: Secondary | ICD-10-CM | POA: Diagnosis not present

## 2024-05-31 MED ORDER — IBUPROFEN 800 MG PO TABS
800.0000 mg | ORAL_TABLET | Freq: Once | ORAL | Status: DC
  Filled 2024-05-31: qty 1

## 2024-05-31 MED ORDER — CYCLOBENZAPRINE HCL 10 MG PO TABS: 10.0000 mg | ORAL_TABLET | Freq: Two times a day (BID) | ORAL | 0 refills | Status: AC | PRN

## 2024-05-31 MED ORDER — PREDNISONE 20 MG PO TABS: ORAL_TABLET | ORAL | 0 refills | Status: AC

## 2024-05-31 MED ORDER — IBUPROFEN 600 MG PO TABS: 600.0000 mg | ORAL_TABLET | Freq: Four times a day (QID) | ORAL | 0 refills | Status: AC | PRN
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
## 2024-05-31 NOTE — Discharge Instructions (Signed)
 You have been evaluated for your injury.  Fortunately x-ray today did not show any evidence of any broken bone.  Unfortunately x-ray cannot rule out ligament injury such as ACL or MCL tear.  Wear knee sleeve and follow-up closely with the orthopedic specialist for further care.  The tingling and numbness sensation to your thigh is likely meralgia paresthetica, a compression against your lateral femoral cutaneous nerve.  Take medication prescribed and follow-up with your doctor for further care.
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
## 2024-06-08 DIAGNOSIS — G5711 Meralgia paresthetica, right lower limb: Secondary | ICD-10-CM | POA: Diagnosis not present

## 2024-06-08 DIAGNOSIS — M25461 Effusion, right knee: Secondary | ICD-10-CM | POA: Diagnosis not present
